# Patient Record
Sex: Female | Born: 1995 | Hispanic: Yes | Marital: Single | Smoking: Never smoker
Health system: Southern US, Community
[De-identification: ages and names within clinical notes are randomized; demographics above are authoritative.]

## PROBLEM LIST (undated history)

## (undated) DIAGNOSIS — N200 Calculus of kidney: Secondary | ICD-10-CM

## (undated) DIAGNOSIS — Z789 Other specified health status: Secondary | ICD-10-CM

## (undated) HISTORY — PX: NO PAST SURGERIES: SHX2092

## (undated) HISTORY — DX: Other specified health status: Z78.9

## (undated) HISTORY — DX: Calculus of kidney: N20.0

---
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
## 2013-08-13 ENCOUNTER — Emergency Department: Payer: Self-pay | Admitting: Emergency Medicine

## 2013-08-13 LAB — BASIC METABOLIC PANEL
Anion Gap: 5 — ABNORMAL LOW (ref 7–16)
BUN: 14 mg/dL (ref 7–18)
CALCIUM: 9.7 mg/dL (ref 9.0–10.7)
CHLORIDE: 105 mmol/L (ref 98–107)
CO2: 25 mmol/L (ref 21–32)
Creatinine: 0.71 mg/dL (ref 0.60–1.30)
EGFR (African American): >60
EGFR (Non-African Amer.): >60
Glucose: 101 mg/dL — ABNORMAL HIGH (ref 65–99)
Osmolality: 271 (ref 275–301)
POTASSIUM: 3.8 mmol/L (ref 3.5–5.1)
Sodium: 135 mmol/L — ABNORMAL LOW (ref 136–145)

## 2013-08-13 LAB — CBC
HCT: 43.2 % (ref 35.0–47.0)
HGB: 13.8 g/dL (ref 12.0–16.0)
MCH: 29.4 pg (ref 26.0–34.0)
MCHC: 32 g/dL (ref 32.0–36.0)
MCV: 92 fL (ref 80–100)
Platelet: 148 10*3/uL — ABNORMAL LOW (ref 150–440)
RBC: 4.72 10*6/uL (ref 3.80–5.20)
RDW: 13.5 % (ref 11.5–14.5)
WBC: 19.5 10*3/uL — ABNORMAL HIGH (ref 3.6–11.0)

## 2013-08-16 LAB — BETA STREP CULTURE(ARMC)

## 2013-12-09 ENCOUNTER — Emergency Department: Payer: Self-pay | Admitting: Emergency Medicine

## 2013-12-09 LAB — URINALYSIS, COMPLETE
BACTERIA: NONE SEEN
Bilirubin,UR: NEGATIVE
GLUCOSE, UR: NEGATIVE mg/dL (ref 0–75)
Ketone: NEGATIVE
Leukocyte Esterase: NEGATIVE
Nitrite: NEGATIVE
Ph: 5 (ref 4.5–8.0)
Protein: NEGATIVE
RBC,UR: 4 /HPF (ref 0–5)
SPECIFIC GRAVITY: 1.026 (ref 1.003–1.030)
Squamous Epithelial: 1
WBC UR: 3 /HPF (ref 0–5)

## 2013-12-09 LAB — BASIC METABOLIC PANEL
Anion Gap: 3 — ABNORMAL LOW (ref 7–16)
BUN: 8 mg/dL (ref 7–18)
CO2: 29 mmol/L (ref 21–32)
CREATININE: 0.69 mg/dL (ref 0.60–1.30)
Calcium, Total: 9.1 mg/dL (ref 9.0–10.7)
Chloride: 107 mmol/L (ref 98–107)
GLUCOSE: 83 mg/dL (ref 65–99)
OSMOLALITY: 275 (ref 275–301)
POTASSIUM: 4.1 mmol/L (ref 3.5–5.1)
Sodium: 139 mmol/L (ref 136–145)

## 2013-12-09 LAB — CBC
HCT: 39.8 % (ref 35.0–47.0)
HGB: 13.3 g/dL (ref 12.0–16.0)
MCH: 30.8 pg (ref 26.0–34.0)
MCHC: 33.4 g/dL (ref 32.0–36.0)
MCV: 92 fL (ref 80–100)
Platelet: 268 10*3/uL (ref 150–440)
RBC: 4.32 10*6/uL (ref 3.80–5.20)
RDW: 13 % (ref 11.5–14.5)
WBC: 5.6 10*3/uL (ref 3.6–11.0)

## 2016-08-18 DIAGNOSIS — L700 Acne vulgaris: Secondary | ICD-10-CM | POA: Insufficient documentation

## 2017-10-25 HISTORY — PX: INTRAUTERINE DEVICE (IUD) INSERTION: SHX5877

## 2018-07-27 ENCOUNTER — Ambulatory Visit: Payer: Self-pay | Admitting: Obstetrics and Gynecology

## 2018-08-10 ENCOUNTER — Ambulatory Visit: Payer: Self-pay | Admitting: Obstetrics and Gynecology

## 2018-09-03 ENCOUNTER — Other Ambulatory Visit (HOSPITAL_COMMUNITY)
Admission: RE | Admit: 2018-09-03 | Discharge: 2018-09-03 | Disposition: A | Discharge status: Home / Self Care | Payer: BLUE CROSS/BLUE SHIELD | Source: Ambulatory Visit | Attending: Obstetrics and Gynecology | Admitting: Obstetrics and Gynecology

## 2018-09-03 ENCOUNTER — Ambulatory Visit (INDEPENDENT_AMBULATORY_CARE_PROVIDER_SITE_OTHER): Payer: BLUE CROSS/BLUE SHIELD | Admitting: Obstetrics and Gynecology

## 2018-09-03 ENCOUNTER — Encounter: Payer: Self-pay | Admitting: Obstetrics and Gynecology

## 2018-09-03 VITALS — BP 104/64 | HR 66 | Ht 64.0 in | Wt 152.0 lb

## 2018-09-03 DIAGNOSIS — Z30431 Encounter for routine checking of intrauterine contraceptive device: Secondary | ICD-10-CM

## 2018-09-03 DIAGNOSIS — Z124 Encounter for screening for malignant neoplasm of cervix: Secondary | ICD-10-CM | POA: Diagnosis not present

## 2018-09-03 DIAGNOSIS — Z113 Encounter for screening for infections with a predominantly sexual mode of transmission: Secondary | ICD-10-CM

## 2018-09-03 DIAGNOSIS — Z01419 Encounter for gynecological examination (general) (routine) without abnormal findings: Secondary | ICD-10-CM

## 2018-09-03 DIAGNOSIS — R102 Pelvic and perineal pain: Secondary | ICD-10-CM

## 2018-09-03 NOTE — Progress Notes (Signed)
Gynecology Annual Exam  PCP: Patient, No Pcp Per  Chief Complaint:  Chief Complaint  Patient presents with  . Pelvic Pain    IUD check    History of Present Illness: Patient is a 23 y.o. G0P0000 presents for annual exam. The patient has no complaints today.   LMP: No LMP recorded. (Menstrual status: IUD). Absent on [place] IUD  The patient is sexually active. She currently uses IUD for contraception. She does report dyspareunia.    There is no notable family history of breast or ovarian cancer in her family.  The patient wears seatbelts: yes.  The patient has regular exercise: not asked.    She reports intermittent pelvic cramping since IUD placement.  She has previously reported heavy menses as well as dysmenorrhea.  Has achieved amenorrhea on IUD.  She does report cramping with intercourse as well.  Has not checked for the strings.  Review of Systems: Review of Systems  Constitutional: Negative.   Respiratory: Negative.   Cardiovascular: Negative.   Gastrointestinal: Positive for abdominal pain and constipation. Negative for diarrhea, heartburn, nausea and vomiting.  Skin: Negative.   Endo/Heme/Allergies: Negative.   Psychiatric/Behavioral: Negative.     Past Medical History:  Past Medical History:  Diagnosis Date  . No known health problems     Past Surgical History:  Past Surgical History:  Procedure Laterality Date  . INTRAUTERINE DEVICE (IUD) INSERTION  10/2017   Kyleena  . NO PAST SURGERIES      Gynecologic History:  No LMP recorded. (Menstrual status: IUD). Contraception: 11/22/2017 Rutha Bouchard IUD Last Pap: Results were: 08/22/2015 Pap NIL HPV neg  Obstetric History: G0P0000  Family History:  History reviewed. No pertinent family history.  Social History:  Social History   Socioeconomic History  . Marital status: Single    Spouse name: Not on file  . Number of children: Not on file  . Years of education: Not on file  . Highest education level:  Not on file  Occupational History  . Not on file  Social Needs  . Financial resource strain: Not on file  . Food insecurity:    Worry: Not on file    Inability: Not on file  . Transportation needs:    Medical: Not on file    Non-medical: Not on file  Tobacco Use  . Smoking status: Never Smoker  . Smokeless tobacco: Never Used  Substance and Sexual Activity  . Alcohol use: Yes    Comment: OCC  . Drug use: Never  . Sexual activity: Yes    Partners: Male    Birth control/protection: I.U.D.  Lifestyle  . Physical activity:    Days per week: Not on file    Minutes per session: Not on file  . Stress: Not on file  Relationships  . Social connections:    Talks on phone: Not on file    Gets together: Not on file    Attends religious service: Not on file    Active member of club or organization: Not on file    Attends meetings of clubs or organizations: Not on file    Relationship status: Not on file  . Intimate partner violence:    Fear of current or ex partner: Not on file    Emotionally abused: Not on file    Physically abused: Not on file    Forced sexual activity: Not on file  Other Topics Concern  . Not on file  Social History Narrative  .  Not on file    Allergies:  No Known Allergies  Medications: Prior to Admission medications   Medication Sig Start Date End Date Taking? Authorizing Provider  Levonorgestrel (KYLEENA) 19.5 MG IUD by Intrauterine route.   Yes [provider]    Physical Exam Vitals: Blood pressure 104/64, pulse 66, height 5\' 4"  (1.626 m), weight 152 lb (68.9 kg).  General: NAD HEENT: normocephalic, anicteric Thyroid: no enlargement, no palpable nodules Pulmonary: No increased work of breathing, CTAB Cardiovascular: RRR, distal pulses 2+ Abdomen: Soft, non-tender, non-distended.  Umbilicus without lesions.   Genitourinary:  External: Normal external female genitalia.  Normal urethral meatus, normal Bartholin's and Skene's glands.      Vagina: Normal vaginal mucosa, no evidence of prolapse.    Cervix: Grossly normal in appearance, no bleeding. IUD strings 3cm  Uterus: Non-enlarged, mobile, normal contour.  No CMT  Adnexa: ovaries non-enlarged, no adnexal masses  Rectal: deferred  Lymphatic: no evidence of inguinal lymphadenopathy Extremities: no edema, erythema, or tenderness Neurologic: Grossly intact Psychiatric: mood appropriate, affect full  Female chaperone present for pelvic and breast  portions of the physical exam    Assessment: 23 y.o. G0P0000 routine annual exam  Plan: Problem List Items Addressed This Visit    None    Visit Diagnoses    Encounter for gynecological examination without abnormal finding    -  Primary   Screening for malignant neoplasm of cervix       Relevant Orders   Cytology - PAP   Routine screening for STI (sexually transmitted infection)       Relevant Orders   Cytology - PAP   IUD check up       Relevant Orders   US PELVIC COMPLETE WITH TRANSVAGINAL   Pelvic pain in female       Relevant Orders   US PELVIC COMPLETE WITH TRANSVAGINAL      1) 4) Gardasil Series discussed and if applicable offered to patient - Patient has not previously completed 3 shot series  - given handout on Gardasil 9  2) STI screening  GC/CT cultures obtained secondary to cramping  3)  ASCCP guidelines and rational discussed.  Patient opts for every 3 years screening interval  4) Contraception - the patient is currently using  IUD.  She is happy with her current form of contraception and plans to continue We discussed safe sex practices to reduce her furture risk of STI's.  - states if IUD in proper location cramping is not signifcant enough where she would desire removal.  Will proceed with ultrasound for confirmation of placement   5) Return in about 1 year (around 09/03/2019), or You will receive a call about the ultrasound scheduling, for annual.   Vena Austria, MD, [facility]  [facility], [facility] 09/03/2018, 10:00 AM

## 2018-09-04 ENCOUNTER — Telehealth: Payer: Self-pay | Admitting: Obstetrics and Gynecology

## 2018-09-04 NOTE — Telephone Encounter (Signed)
Patient is aware of appointment, location, and instructions, and f/u appt w/ Dr. Bonney Aid has been scheduled.

## 2018-09-04 NOTE — Telephone Encounter (Signed)
Once again not me just saw her yesterday

## 2018-09-04 NOTE — Telephone Encounter (Signed)
I did not call patient. Message forwarded to AMS

## 2018-09-04 NOTE — Telephone Encounter (Signed)
Nancy, please advise..

## 2018-09-04 NOTE — Telephone Encounter (Signed)
Patient received missed call. Patient was seen 09/03/18.

## 2018-09-04 NOTE — Telephone Encounter (Signed)
Patient is schedule for ultrasound Tonya May was trying to reach patient, I didn't see note in referral until I have sent this message

## 2018-09-06 LAB — CYTOLOGY - PAP
Chlamydia: NEGATIVE
DIAGNOSIS: NEGATIVE
NEISSERIA GONORRHEA: NEGATIVE

## 2018-09-17 ENCOUNTER — Ambulatory Visit: Payer: BLUE CROSS/BLUE SHIELD

## 2018-09-20 ENCOUNTER — Ambulatory Visit: Payer: BLUE CROSS/BLUE SHIELD | Admitting: Obstetrics and Gynecology

## 2018-10-15 ENCOUNTER — Ambulatory Visit: Payer: BLUE CROSS/BLUE SHIELD

## 2018-10-22 ENCOUNTER — Encounter: Payer: Self-pay | Admitting: Obstetrics and Gynecology

## 2018-10-22 ENCOUNTER — Other Ambulatory Visit: Payer: Self-pay

## 2018-10-22 ENCOUNTER — Ambulatory Visit (INDEPENDENT_AMBULATORY_CARE_PROVIDER_SITE_OTHER): Payer: BLUE CROSS/BLUE SHIELD

## 2018-10-22 ENCOUNTER — Ambulatory Visit (INDEPENDENT_AMBULATORY_CARE_PROVIDER_SITE_OTHER): Payer: BLUE CROSS/BLUE SHIELD | Admitting: Obstetrics and Gynecology

## 2018-10-22 VITALS — BP 100/64 | HR 71 | Wt 153.0 lb

## 2018-10-22 DIAGNOSIS — Z30431 Encounter for routine checking of intrauterine contraceptive device: Secondary | ICD-10-CM

## 2018-10-22 DIAGNOSIS — R102 Pelvic and perineal pain: Secondary | ICD-10-CM | POA: Diagnosis not present

## 2018-10-22 NOTE — Progress Notes (Signed)
Gynecology Ultrasound Follow Up  Chief Complaint:  Chief Complaint  Patient presents with  . Follow-up    GYN ultrasound     History of Present Illness: Patient is a 23 y.o. female who presents today for ultrasound evaluation of pelvic pain, cramping .  Ultrasound demonstrates the following findgins Adnexa: no masses seen { Uterus: Non-enlarged, no evidence of fibroid with endometrial stripe  5mm and homogenous, IUD in proper location with appropriate deployment of both arms Additional: no free fluid  Review of Systems: Review of Systems  Constitutional: Negative.   Genitourinary: Negative.     Past Medical History:  Past Medical History:  Diagnosis Date  . No known health problems     Past Surgical History:  Past Surgical History:  Procedure Laterality Date  . INTRAUTERINE DEVICE (IUD) INSERTION  10/2017   Kyleena  . NO PAST SURGERIES      Gynecologic History:  No LMP recorded. (Menstrual status: IUD). Contraception: IUD Last Pap: 09/03/2018  Results were: .no abnormalities  Family History:  History reviewed. No pertinent family history.  Social History:  Social History   Socioeconomic History  . Marital status: Single    Spouse name: Not on file  . Number of children: Not on file  . Years of education: Not on file  . Highest education level: Not on file  Occupational History  . Not on file  Social Needs  . Financial resource strain: Not on file  . Food insecurity:    Worry: Not on file    Inability: Not on file  . Transportation needs:    Medical: Not on file    Non-medical: Not on file  Tobacco Use  . Smoking status: Never Smoker  . Smokeless tobacco: Never Used  Substance and Sexual Activity  . Alcohol use: Yes    Comment: OCC  . Drug use: Never  . Sexual activity: Yes    Partners: Male    Birth control/protection: I.U.D.  Lifestyle  . Physical activity:    Days per week: Not on file    Minutes per session: Not on file  . Stress: Not  on file  Relationships  . Social connections:    Talks on phone: Not on file    Gets together: Not on file    Attends religious service: Not on file    Active member of club or organization: Not on file    Attends meetings of clubs or organizations: Not on file    Relationship status: Not on file  . Intimate partner violence:    Fear of current or ex partner: Not on file    Emotionally abused: Not on file    Physically abused: Not on file    Forced sexual activity: Not on file  Other Topics Concern  . Not on file  Social History Narrative  . Not on file    Allergies:  No Known Allergies  Medications: Prior to Admission medications   Medication Sig Start Date End Date Taking? Authorizing Provider  Levonorgestrel (KYLEENA) 19.5 MG IUD by Intrauterine route.    [provider]    Physical Exam Vitals: Blood pressure 100/64, pulse 71, weight 153 lb (69.4 kg).  General: NAD, well nourished, appears stated age HEENT: normocephalic, anicteric Pulmonary: No increased work of breathing Neurologic: Grossly intact, normal gait Psychiatric: mood appropriate, affect full  [place] Pelvic Complete With Transvaginal  Result Date: 10/22/2018 [name] DOB: Sep 27, 1995 MRN: [id number] ULTRASOUND REPORT [facility] Date of Service: 10/22/2018 Indication: IUD Findings: The uterus is anteverted and measures 7.01 x 2.87 x 2.85 cm. Echo texture is homogenous without evidence of focal masses. The Endometrium measures 5 mm. Right Ovary measures 2.8 x 2.3 x 2.0 cm. It is normal in appearance. Left Ovary measures 2.9 x 1.8 x 1.7 cm. It is normal in appearance. Survey of the adnexa demonstrates no adnexal masses. There is no free fluid in the cul de sac. Impression: 1.IUD seen with in the Endometrium. Recommendations: 1.Clinical correlation with the patient's History and Physical Exam. Clydene Laming, RDMS Images reviewed.  Normal GYN study without visualized pathology.   Vena Austria, MD, [facility], [facility] 10/22/2018, 11:32 AM     Assessment: 23 y.o. G0P0000 IUD check up and follow up ultrasound pelvic pain  Plan: Problem List Items Addressed This Visit    None    Visit Diagnoses    Pelvic pain in female    -  Primary      1) Pelvic pain - IUD in appropriate position, no GYN pathology on imaging.  At this time patient feels sufficiently reassured and would like to continue with Mirena IUD  2) A total of 15 minutes were spent in face-to-face contact with the patient during this encounter with over half of that time devoted to counseling and coordination of care.  3) Return in about 1 year (around 10/22/2019) for annual exam.    Vena Austria, MD, [facility], [facility] 10/22/2018, 11:30 AM

## 2019-01-02 ENCOUNTER — Encounter: Payer: Self-pay | Admitting: *Deleted

## 2019-01-02 ENCOUNTER — Emergency Department: Payer: BLUE CROSS/BLUE SHIELD

## 2019-01-02 ENCOUNTER — Other Ambulatory Visit: Payer: Self-pay

## 2019-01-02 ENCOUNTER — Emergency Department
Admission: EM | Admit: 2019-01-02 | Discharge: 2019-01-02 | Disposition: A | Discharge status: Home / Self Care | Payer: BLUE CROSS/BLUE SHIELD | Attending: Emergency Medicine | Admitting: Emergency Medicine

## 2019-01-02 DIAGNOSIS — K529 Noninfective gastroenteritis and colitis, unspecified: Secondary | ICD-10-CM | POA: Insufficient documentation

## 2019-01-02 DIAGNOSIS — E876 Hypokalemia: Secondary | ICD-10-CM | POA: Insufficient documentation

## 2019-01-02 DIAGNOSIS — Z3202 Encounter for pregnancy test, result negative: Secondary | ICD-10-CM | POA: Insufficient documentation

## 2019-01-02 DIAGNOSIS — R1031 Right lower quadrant pain: Secondary | ICD-10-CM | POA: Diagnosis present

## 2019-01-02 LAB — COMPREHENSIVE METABOLIC PANEL
ALT: 27 U/L (ref 0–44)
AST: 32 U/L (ref 15–41)
Albumin: 4.5 g/dL (ref 3.5–5.0)
Alkaline Phosphatase: 68 U/L (ref 38–126)
Anion gap: 13 (ref 5–15)
BUN: 9 mg/dL (ref 6–20)
CO2: 21 mmol/L — ABNORMAL LOW (ref 22–32)
Calcium: 9.1 mg/dL (ref 8.9–10.3)
Chloride: 103 mmol/L (ref 98–111)
Creatinine, Ser: 0.61 mg/dL (ref 0.44–1.00)
GFR calc Af Amer: >60 mL/min (ref >60)
GFR calc non Af Amer: >60 mL/min (ref >60)
Glucose, Bld: 130 mg/dL — ABNORMAL HIGH (ref 70–99)
Potassium: 2.7 mmol/L — CL (ref 3.5–5.1)
Sodium: 137 mmol/L (ref 135–145)
Total Bilirubin: 0.4 mg/dL (ref 0.3–1.2)
Total Protein: 7.9 g/dL (ref 6.5–8.1)

## 2019-01-02 LAB — URINALYSIS, COMPLETE (UACMP) WITH MICROSCOPIC
Bacteria, UA: NONE SEEN
Bilirubin Urine: NEGATIVE
Glucose, UA: NEGATIVE mg/dL
Ketones, ur: 5 mg/dL — AB
Leukocytes,Ua: NEGATIVE
Nitrite: NEGATIVE
Protein, ur: NEGATIVE mg/dL
Specific Gravity, Urine: 1.016 (ref 1.005–1.030)
pH: 5 (ref 5.0–8.0)

## 2019-01-02 LAB — CBC
HCT: 39.3 % (ref 36.0–46.0)
Hemoglobin: 13.2 g/dL (ref 12.0–15.0)
MCH: 29.5 pg (ref 26.0–34.0)
MCHC: 33.6 g/dL (ref 30.0–36.0)
MCV: 87.7 fL (ref 80.0–100.0)
Platelets: 348 10*3/uL (ref 150–400)
RBC: 4.48 MIL/uL (ref 3.87–5.11)
RDW: 12.3 % (ref 11.5–15.5)
WBC: 14.9 10*3/uL — ABNORMAL HIGH (ref 4.0–10.5)
nRBC: 0 % (ref 0.0–0.2)

## 2019-01-02 LAB — LIPASE, BLOOD: Lipase: 31 U/L (ref 11–51)

## 2019-01-02 LAB — POCT PREGNANCY, URINE: Preg Test, Ur: NEGATIVE

## 2019-01-02 MED ORDER — IOHEXOL 300 MG/ML  SOLN
100.0000 mL | Freq: Once | INTRAMUSCULAR | Status: AC | PRN
  Administered 2019-01-02: 05:00:00 100 mL via INTRAVENOUS

## 2019-01-02 MED ORDER — HYDROCODONE-ACETAMINOPHEN 5-325 MG PO TABS: 1.0000 | ORAL_TABLET | ORAL | 0 refills | Status: DC | PRN

## 2019-01-02 MED ORDER — ONDANSETRON 4 MG PO TBDP: 4.0000 mg | ORAL_TABLET | Freq: Three times a day (TID) | ORAL | 0 refills | Status: DC | PRN

## 2019-01-02 MED ORDER — POTASSIUM CHLORIDE CRYS ER 10 MEQ PO TBCR: 10.0000 meq | EXTENDED_RELEASE_TABLET | Freq: Two times a day (BID) | ORAL | 0 refills | Status: DC

## 2019-01-02 MED ORDER — IOHEXOL 240 MG/ML SOLN
50.0000 mL | Freq: Once | INTRAMUSCULAR | Status: AC
  Administered 2019-01-02: 50 mL via ORAL

## 2019-01-02 MED ORDER — SODIUM CHLORIDE 0.9% FLUSH: 3.0000 mL | Freq: Once | INTRAVENOUS | Status: DC

## 2019-01-02 MED ORDER — SODIUM CHLORIDE 0.9 % IV BOLUS
1000.0000 mL | Freq: Once | INTRAVENOUS | Status: AC
  Administered 2019-01-02: 1000 mL via INTRAVENOUS

## 2019-01-02 MED ORDER — ONDANSETRON HCL 4 MG/2ML IJ SOLN
4.0000 mg | Freq: Once | INTRAMUSCULAR | Status: AC
  Administered 2019-01-02: 4 mg via INTRAVENOUS
  Filled 2019-01-02: qty 2

## 2019-01-02 MED ORDER — MORPHINE SULFATE (PF) 4 MG/ML IV SOLN
4.0000 mg | Freq: Once | INTRAVENOUS | Status: AC
  Administered 2019-01-02: 4 mg via INTRAVENOUS
  Filled 2019-01-02: qty 1

## 2019-01-02 NOTE — ED Triage Notes (Signed)
Pt reports abd pain that started tonight.  Pt states she has nausea.   No vomiting/diarrhea. Taking otc meds without relief.  No urinary sx. No vag bleeding.   Pt alert.

## 2019-01-02 NOTE — ED Provider Notes (Signed)
[facility] Emergency Department Provider Note  Time seen: 3:47 AM  I have reviewed the triage vital signs and the nursing notes.   HISTORY  Chief Complaint Abdominal Pain  HPI Tonya May is a 23 y.o. female with no significant past medical history presents emergency department for lower abdominal pain since last night.  According to the patient since last night she developed lower abdominal pain mostly in the right lower quadrant.  States she is also been nauseated, developed vomiting and diarrhea.  Patient denies any known fever.  Denies any cough congestion or shortness of breath.  Describes her abdominal pain as moderate dull aching pain across the lower abdomen but more so in the right lower quadrant.   Patient denies any dysuria or hematuria.  Denies any vaginal bleeding or discharge.  Past Medical History:  Diagnosis Date  . No known health problems     There are no active problems to display for this patient.   Past Surgical History:  Procedure Laterality Date  . INTRAUTERINE DEVICE (IUD) INSERTION  10/2017   Kyleena  . NO PAST SURGERIES      Prior to Admission medications   Medication Sig Start Date End Date Taking? Authorizing Provider  Levonorgestrel (KYLEENA) 19.5 MG IUD by Intrauterine route.    [provider]    No Known Allergies  No family history on file.  Social History Social History   Tobacco Use  . Smoking status: Never Smoker  . Smokeless tobacco: Never Used  Substance Use Topics  . Alcohol use: Yes    Comment: OCC  . Drug use: Never    Review of Systems Constitutional: Negative for fever. Cardiovascular: Negative for chest pain. Respiratory: Negative for shortness of breath. Gastrointestinal: Moderate right lower quadrant abdominal pain.  Moderate lower abdominal pain.  Positive for nausea vomiting diarrhea. Genitourinary: Negative for urinary compaints.  Negative for vaginal bleeding or  discharge. Musculoskeletal: Negative for musculoskeletal complaints Skin: Negative for skin complaints  Neurological: Negative for headache All other ROS negative  ____________________________________________   PHYSICAL EXAM:  VITAL SIGNS: ED Triage Vitals  Enc Vitals Group     BP 01/02/19 0026 119/63     Pulse Rate 01/02/19 0026 82     Resp 01/02/19 0026 (!) 24     Temp 01/02/19 0026 98.9 F (37.2 C)     Temp Source 01/02/19 0026 Oral     SpO2 01/02/19 0026 100 %     Weight --      Height --      Head Circumference --      Peak Flow --      Pain Score 01/02/19 0027 8     Pain Loc --      Pain Edu? --      Excl. in [place]? --    Constitutional: Alert and oriented. Well appearing and in no distress. Eyes: Normal exam ENT      Head: Normocephalic and atraumatic.      Mouth/Throat: Mucous membranes are moist. Cardiovascular: Normal rate, regular rhythm.  Respiratory: Normal respiratory effort without tachypnea nor retractions. Breath sounds are clear Gastrointestinal: Soft, moderate lower abdominal tenderness to palpation especially in the right lower quadrant.  Tender over McBurney's point.  No rebound guarding or distention. Musculoskeletal: Nontender with normal range of motion in all extremities.  Neurologic:  Normal speech and language. No gross focal neurologic deficits  Skin:  Skin is warm, dry and intact.  Psychiatric: Mood and affect  are normal. Speech and behavior are normal.   ____________________________________________    RADIOLOGY  CT scan shows no acute abnormalities.  ____________________________________________   INITIAL IMPRESSION / ASSESSMENT AND PLAN / ED COURSE  Pertinent labs & imaging results that were available during my care of the patient were reviewed by me and considered in my medical decision making (see chart for details).   Patient presents emergency department for right lower quadrant abdominal pain nausea vomiting diarrhea.   Differential would include gastroenteritis, enteritis, mesenteric adenitis, appendicitis, ovarian cyst or hemorrhagic cyst.  Patient's lab work shows a moderate leukocytosis of 14,900, mild hypokalemia.  Pregnancy test is negative.  Urinalysis shows no acute findings.  We will obtain a CT scan of the abdomen/pelvis to further evaluate and to rule out appendicitis.  Patient does not wish for any pain or nausea medication at this time we will IV hydrate prior to CT imaging.  CT does not appear to show any acute abnormality such as appendicitis, possible mild lymphadenopathy.  Possible gastroenteritis.  We will treat pain and nausea in the emergency department of breath this patient is now agreeable).  Overall the patient appears well, with an overall reassuring work-up.  We will discharge with PCP follow-up.  Patient agreeable to plan of care.  Tonya May was evaluated in Emergency Department on 01/02/2019 for the symptoms described in the history of present illness. She was evaluated in the context of the global COVID-19 pandemic, which necessitated consideration that the patient might be at risk for infection with the SARS-CoV-2 virus that causes COVID-19. Institutional protocols and algorithms that pertain to the evaluation of patients at risk for COVID-19 are in a state of rapid change based on information released by regulatory bodies including the CDC and federal and state organizations. These policies and algorithms were followed during the patient's care in the ED.  ____________________________________________   FINAL CLINICAL IMPRESSION(S) / ED DIAGNOSES  Right lower quadrant abdominal pain   Minna AntisPaduchowski, Dasani Crear, MD 01/02/19 [id number]

## 2020-11-04 IMAGING — CT CT ABDOMEN AND PELVIS WITH CONTRAST
2 of 4 series · 14 of 46 positions shown, 16 images · IV contrast (APPLIED)
Comparison: One-view abdomen 04/26/2011

CLINICAL DATA: Abdominal pain.  Appendicitis suspected.

EXAM:
CT ABDOMEN AND PELVIS WITH CONTRAST
TECHNIQUE: Multidetector CT imaging of the abdomen and pelvis was performed
using the standard protocol following bolus administration of
intravenous contrast.
CONTRAST:  100mL OMNIPAQUE IOHEXOL 300 MG/ML  SOLN

[Series 2: routine abd/pel with · axial · 0.62mm/px · z∈[-883,-493]mm · 11 of 94 slices shown, 13 images]
[im 8/94  soft-tissue]
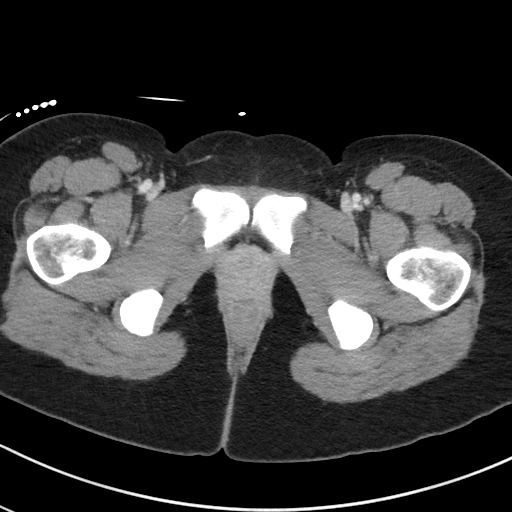
[im 8/94  bone]
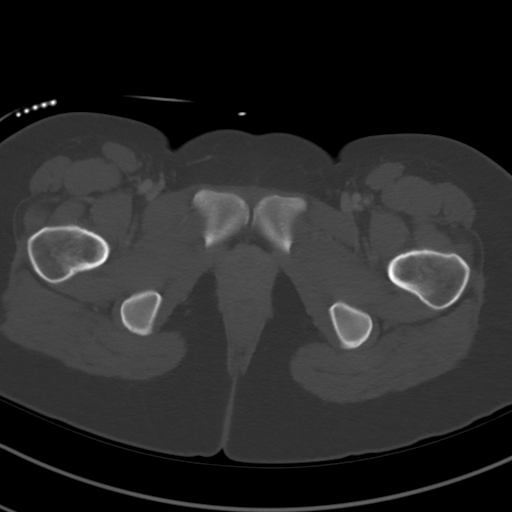
[im 16/94  soft-tissue]
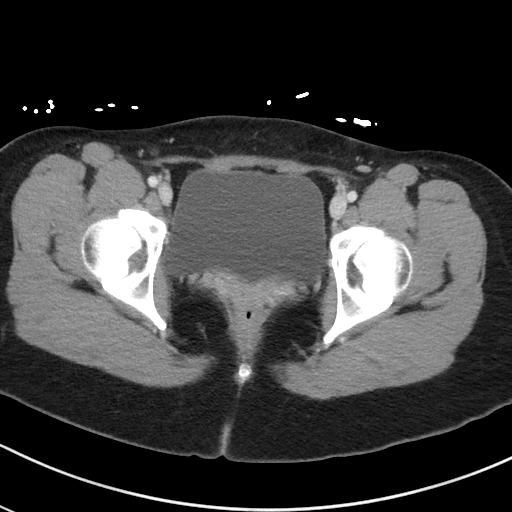
[im 24/94  soft-tissue]
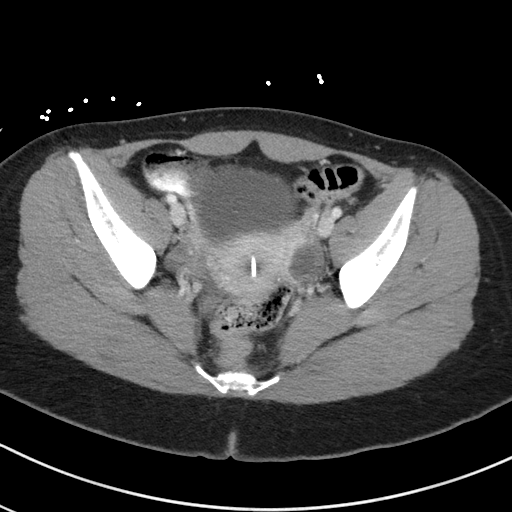
[im 32/94  soft-tissue]
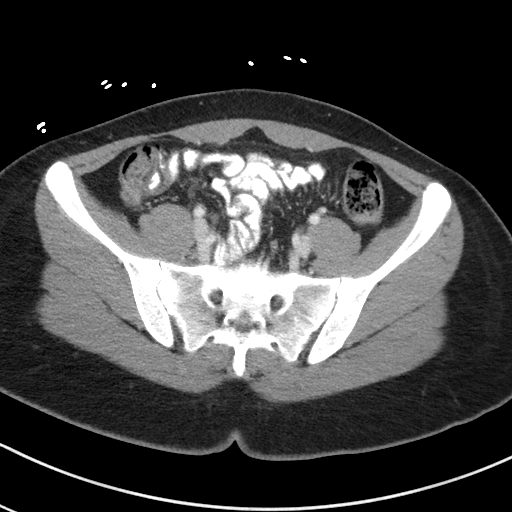
[im 39/94  soft-tissue]
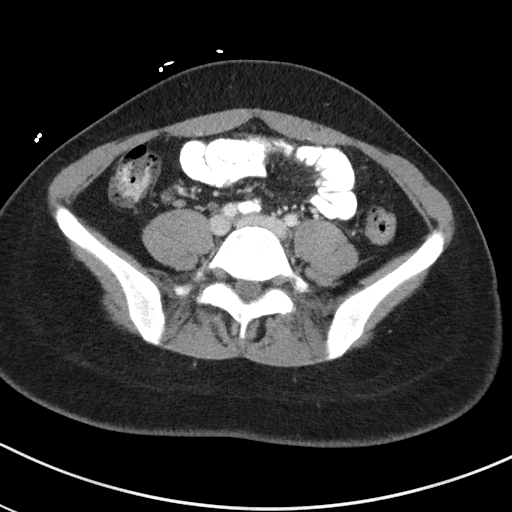
[im 47/94  soft-tissue]
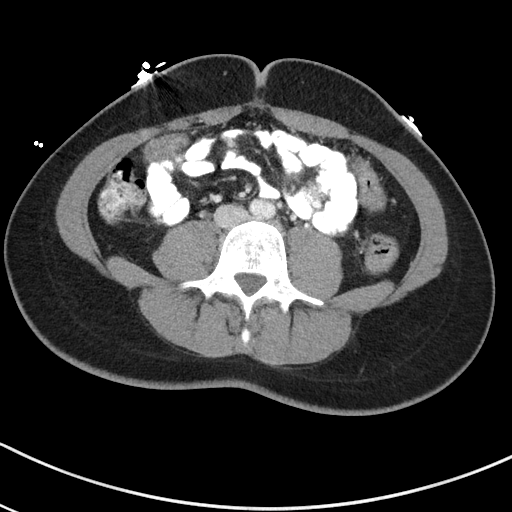
[im 55/94  soft-tissue]
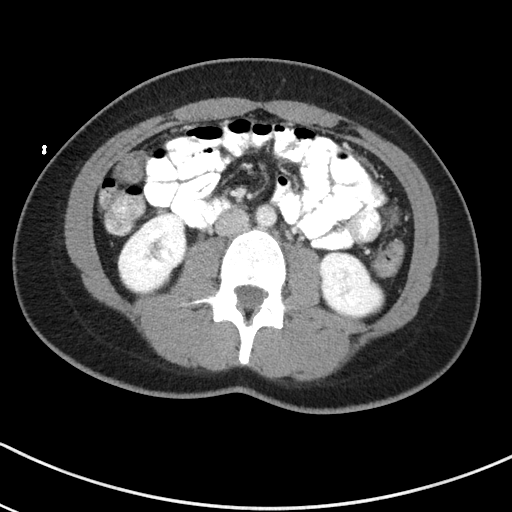
[im 63/94  soft-tissue]
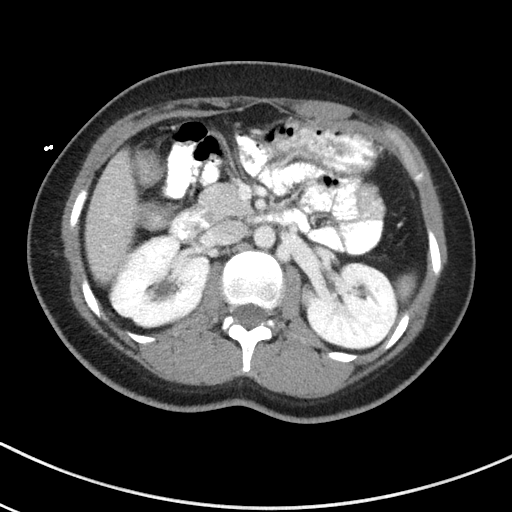
[im 70/94  soft-tissue]
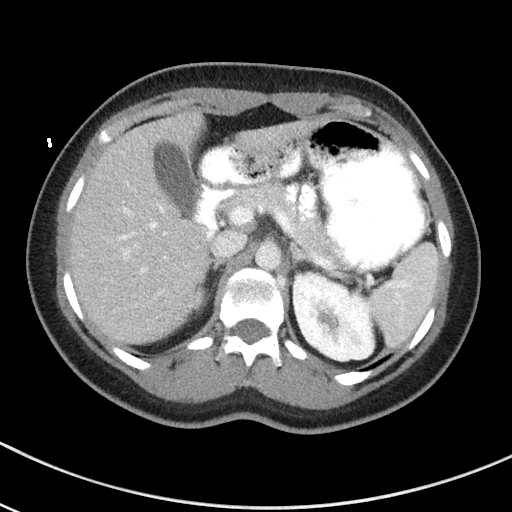
[im 70/94  bone]
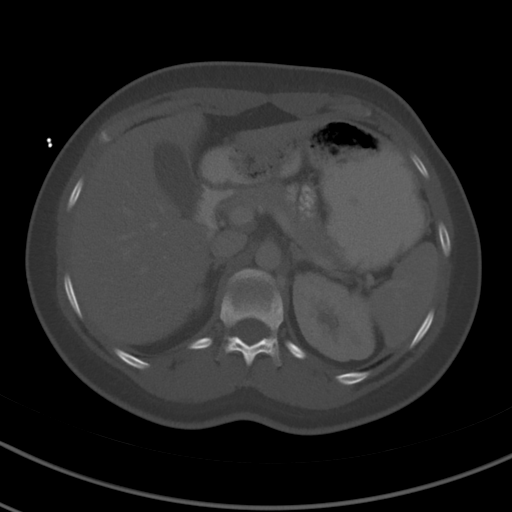
[im 78/94  soft-tissue]
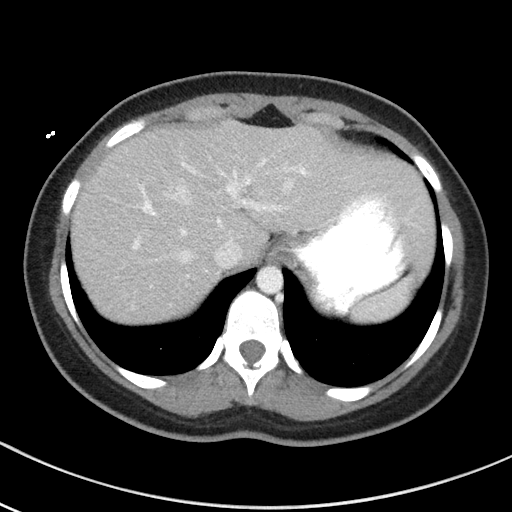
[im 86/94  soft-tissue]
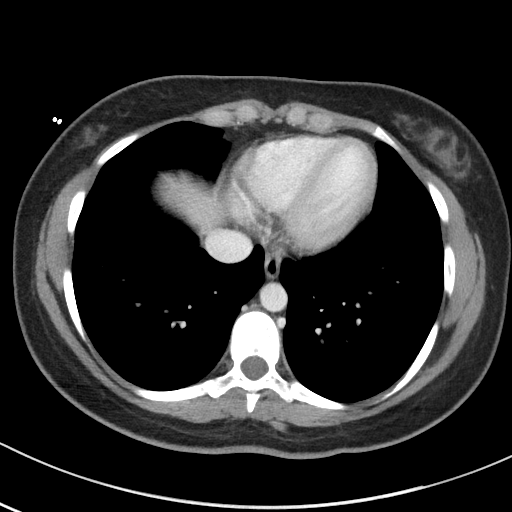

[Series 5: coronal st · coronal · 0.61mm/px · 3 of 82 slices shown]
[im 28/82  soft-tissue]
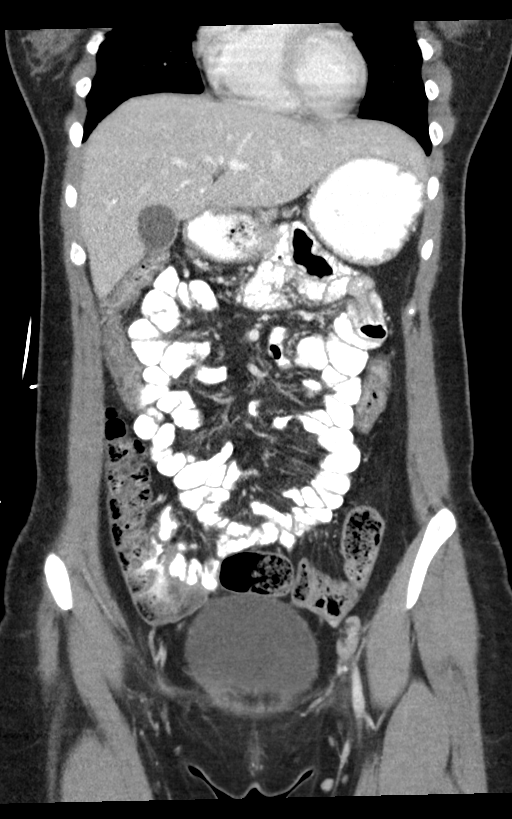
[im 37/82  soft-tissue]
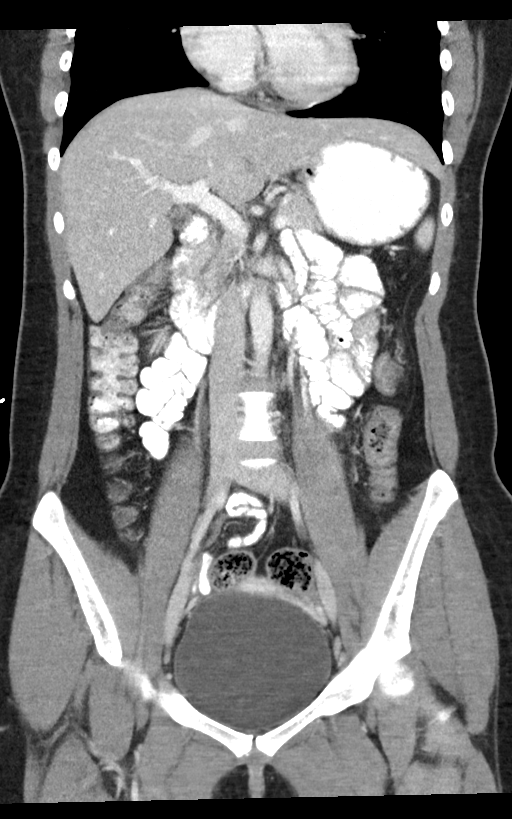
[im 46/82  soft-tissue]
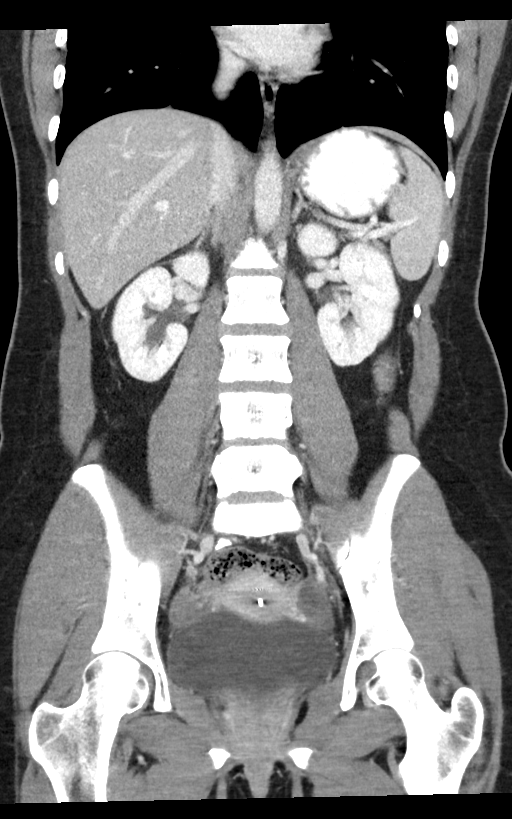

[14 of 46 positions shown; findings below may reference images not displayed]

FINDINGS: Lower chest: Lung bases are clear without focal nodule, mass, or
airspace disease. The heart size is normal. No significant pleural
or pericardial effusion is present.

Hepatobiliary: No focal liver abnormality is seen. No gallstones,
gallbladder wall thickening, or biliary dilatation.

Pancreas: Unremarkable. No pancreatic ductal dilatation or
surrounding inflammatory changes.

Spleen: Normal in size without focal abnormality.

Adrenals/Urinary Tract: Adrenal glands are normal bilaterally. A 5
mm hypodense lesion at the upper pole of the left kidney likely
represents a simple cyst. Kidneys and ureters are otherwise within
normal limits. There is no stone or mass lesion. The urinary bladder
is within normal limits.

Stomach/Bowel: The stomach and duodenum are within normal limits.
Small bowel is unremarkable. Contrast is present to the level of the
cecum. Terminal ileum is within normal limits. The appendix is
visualized and within normal limits. Ascending and transverse colon
are normal. Descending colon is normal. There is moderate stool
throughout the sigmoid colon. No significant inflammatory changes
are present.

Vascular/Lymphatic: No significant vascular findings are present.
Subcentimeter lymph nodes are present along the ileocolic ligament.
No significant retroperitoneal adenopathy is present.

Reproductive: IUD is in place. Uterus and adnexa are otherwise
within normal limits.

Other: There is no free fluid. No significant ventral hernia is
present.

Musculoskeletal: 5 non rib-bearing lumbar type vertebral bodies are
present. Vertebral body heights alignment are maintained. No focal
lytic or blastic lesions are present. Pelvis is within normal
limits. The hips are located and normal.
IMPRESSION: 1. No acute or focal lesion to explain the patient's right lower
quadrant abdominal pain. Normal appearance of the appendix.
2. Subcentimeter lymph nodes within the ileocolic ligament could
represent low level inflammatory change.
3. IUD without complicating features.

## 2022-05-27 DIAGNOSIS — Z419 Encounter for procedure for purposes other than remedying health state, unspecified: Secondary | ICD-10-CM | POA: Diagnosis not present

## 2022-06-17 DIAGNOSIS — J101 Influenza due to other identified influenza virus with other respiratory manifestations: Secondary | ICD-10-CM | POA: Diagnosis not present

## 2022-06-17 DIAGNOSIS — J019 Acute sinusitis, unspecified: Secondary | ICD-10-CM | POA: Diagnosis not present

## 2022-06-17 DIAGNOSIS — J028 Acute pharyngitis due to other specified organisms: Secondary | ICD-10-CM | POA: Diagnosis not present

## 2022-06-17 DIAGNOSIS — Z03818 Encounter for observation for suspected exposure to other biological agents ruled out: Secondary | ICD-10-CM | POA: Diagnosis not present

## 2022-06-27 DIAGNOSIS — Z419 Encounter for procedure for purposes other than remedying health state, unspecified: Secondary | ICD-10-CM | POA: Diagnosis not present

## 2022-06-28 DIAGNOSIS — R6889 Other general symptoms and signs: Secondary | ICD-10-CM | POA: Diagnosis not present

## 2022-07-26 ENCOUNTER — Encounter: Payer: Self-pay | Admitting: Internal Medicine

## 2022-07-26 ENCOUNTER — Inpatient Hospital Stay
Admission: EM | Admit: 2022-07-26 | Discharge: 2022-07-29 | DRG: 854 | Disposition: A | Discharge status: Home / Self Care | Payer: Medicaid Other | Attending: Student | Admitting: Student

## 2022-07-26 ENCOUNTER — Other Ambulatory Visit: Payer: Self-pay

## 2022-07-26 ENCOUNTER — Emergency Department: Payer: Medicaid Other

## 2022-07-26 DIAGNOSIS — A419 Sepsis, unspecified organism: Secondary | ICD-10-CM | POA: Diagnosis present

## 2022-07-26 DIAGNOSIS — A4101 Sepsis due to Methicillin susceptible Staphylococcus aureus: Secondary | ICD-10-CM | POA: Diagnosis not present

## 2022-07-26 DIAGNOSIS — E669 Obesity, unspecified: Secondary | ICD-10-CM | POA: Diagnosis present

## 2022-07-26 DIAGNOSIS — Z6833 Body mass index (BMI) 33.0-33.9, adult: Secondary | ICD-10-CM

## 2022-07-26 DIAGNOSIS — N136 Pyonephrosis: Secondary | ICD-10-CM | POA: Diagnosis not present

## 2022-07-26 DIAGNOSIS — N12 Tubulo-interstitial nephritis, not specified as acute or chronic: Secondary | ICD-10-CM

## 2022-07-26 DIAGNOSIS — R9431 Abnormal electrocardiogram [ECG] [EKG]: Secondary | ICD-10-CM | POA: Diagnosis not present

## 2022-07-26 DIAGNOSIS — N10 Acute tubulo-interstitial nephritis: Secondary | ICD-10-CM

## 2022-07-26 DIAGNOSIS — Z975 Presence of (intrauterine) contraceptive device: Secondary | ICD-10-CM | POA: Diagnosis not present

## 2022-07-26 DIAGNOSIS — N132 Hydronephrosis with renal and ureteral calculous obstruction: Secondary | ICD-10-CM | POA: Diagnosis not present

## 2022-07-26 DIAGNOSIS — N201 Calculus of ureter: Secondary | ICD-10-CM | POA: Diagnosis present

## 2022-07-26 DIAGNOSIS — N1330 Unspecified hydronephrosis: Secondary | ICD-10-CM | POA: Diagnosis present

## 2022-07-26 DIAGNOSIS — K760 Fatty (change of) liver, not elsewhere classified: Secondary | ICD-10-CM | POA: Diagnosis not present

## 2022-07-26 DIAGNOSIS — N200 Calculus of kidney: Secondary | ICD-10-CM | POA: Diagnosis not present

## 2022-07-26 DIAGNOSIS — N23 Unspecified renal colic: Secondary | ICD-10-CM | POA: Diagnosis not present

## 2022-07-26 HISTORY — DX: Acute pyelonephritis: N10

## 2022-07-26 LAB — URINALYSIS, ROUTINE W REFLEX MICROSCOPIC
Bilirubin Urine: NEGATIVE
Glucose, UA: NEGATIVE mg/dL
Ketones, ur: 5 mg/dL — AB
Nitrite: NEGATIVE
Protein, ur: 30 mg/dL — AB
Specific Gravity, Urine: 1.021 (ref 1.005–1.030)
WBC, UA: >50 WBC/hpf (ref 0–5)
pH: 6 (ref 5.0–8.0)

## 2022-07-26 LAB — CBC WITH DIFFERENTIAL/PLATELET
Abs Immature Granulocytes: 0.13 10*3/uL — ABNORMAL HIGH (ref 0.00–0.07)
Basophils Absolute: 0.1 10*3/uL (ref 0.0–0.1)
Basophils Relative: 0 %
Eosinophils Absolute: 0.2 10*3/uL (ref 0.0–0.5)
Eosinophils Relative: 1 %
HCT: 41 % (ref 36.0–46.0)
Hemoglobin: 13.4 g/dL (ref 12.0–15.0)
Immature Granulocytes: 1 %
Lymphocytes Relative: 16 %
Lymphs Abs: 3.5 10*3/uL (ref 0.7–4.0)
MCH: 28.8 pg (ref 26.0–34.0)
MCHC: 32.7 g/dL (ref 30.0–36.0)
MCV: 88.2 fL (ref 80.0–100.0)
Monocytes Absolute: 1.7 10*3/uL — ABNORMAL HIGH (ref 0.1–1.0)
Monocytes Relative: 8 %
Neutro Abs: 15.8 10*3/uL — ABNORMAL HIGH (ref 1.7–7.7)
Neutrophils Relative %: 74 %
Platelets: 366 10*3/uL (ref 150–400)
RBC: 4.65 MIL/uL (ref 3.87–5.11)
RDW: 13 % (ref 11.5–15.5)
WBC: 21.3 10*3/uL — ABNORMAL HIGH (ref 4.0–10.5)
nRBC: 0 % (ref 0.0–0.2)

## 2022-07-26 LAB — COMPREHENSIVE METABOLIC PANEL
ALT: 28 U/L (ref 0–44)
AST: 32 U/L (ref 15–41)
Albumin: 4.4 g/dL (ref 3.5–5.0)
Alkaline Phosphatase: 70 U/L (ref 38–126)
Anion gap: 7 (ref 5–15)
BUN: 14 mg/dL (ref 6–20)
CO2: 23 mmol/L (ref 22–32)
Calcium: 9 mg/dL (ref 8.9–10.3)
Chloride: 104 mmol/L (ref 98–111)
Creatinine, Ser: 0.94 mg/dL (ref 0.44–1.00)
GFR, Estimated: >60 mL/min (ref >60)
Glucose, Bld: 119 mg/dL — ABNORMAL HIGH (ref 70–99)
Potassium: 4.3 mmol/L (ref 3.5–5.1)
Sodium: 134 mmol/L — ABNORMAL LOW (ref 135–145)
Total Bilirubin: 0.9 mg/dL (ref 0.3–1.2)
Total Protein: 8.3 g/dL — ABNORMAL HIGH (ref 6.5–8.1)

## 2022-07-26 LAB — PROCALCITONIN: Procalcitonin: <0.1 ng/mL

## 2022-07-26 LAB — HIV ANTIBODY (ROUTINE TESTING W REFLEX): HIV Screen 4th Generation wRfx: NONREACTIVE

## 2022-07-26 LAB — PROTIME-INR
INR: 1.2 (ref 0.8–1.2)
Prothrombin Time: 15 seconds (ref 11.4–15.2)

## 2022-07-26 LAB — LACTIC ACID, PLASMA
Lactic Acid, Venous: 1.7 mmol/L (ref 0.5–1.9)
Lactic Acid, Venous: 1.6 mmol/L (ref 0.5–1.9)

## 2022-07-26 LAB — APTT: aPTT: 31 seconds (ref 24–36)

## 2022-07-26 LAB — POC URINE PREG, ED: Preg Test, Ur: NEGATIVE

## 2022-07-26 MED ORDER — ACETAMINOPHEN 325 MG PO TABS
650.0000 mg | ORAL_TABLET | Freq: Four times a day (QID) | ORAL | Status: DC | PRN
  Administered 2022-07-26 – 2022-07-28 (×5): 650 mg via ORAL
  Filled 2022-07-26 (×6): qty 2

## 2022-07-26 MED ORDER — OXYCODONE-ACETAMINOPHEN 5-325 MG PO TABS
1.0000 | ORAL_TABLET | Freq: Once | ORAL | Status: AC
  Administered 2022-07-26: 1 via ORAL
  Filled 2022-07-26: qty 1

## 2022-07-26 MED ORDER — KETOROLAC TROMETHAMINE 15 MG/ML IJ SOLN
15.0000 mg | Freq: Once | INTRAMUSCULAR | Status: AC
  Administered 2022-07-26: 15 mg via INTRAVENOUS
  Filled 2022-07-26: qty 1

## 2022-07-26 MED ORDER — IBUPROFEN 400 MG PO TABS
400.0000 mg | ORAL_TABLET | Freq: Once | ORAL | Status: AC
  Administered 2022-07-26: 400 mg via ORAL
  Filled 2022-07-26: qty 1

## 2022-07-26 MED ORDER — ONDANSETRON HCL 4 MG/2ML IJ SOLN
4.0000 mg | Freq: Once | INTRAMUSCULAR | Status: AC
  Administered 2022-07-26: 4 mg via INTRAVENOUS
  Filled 2022-07-26: qty 2

## 2022-07-26 MED ORDER — LACTATED RINGERS IV BOLUS
1000.0000 mL | Freq: Once | INTRAVENOUS | Status: AC
  Administered 2022-07-26: 1000 mL via INTRAVENOUS

## 2022-07-26 MED ORDER — ACETAMINOPHEN 325 MG PO TABS
650.0000 mg | ORAL_TABLET | Freq: Once | ORAL | Status: AC
  Administered 2022-07-26: 650 mg via ORAL
  Filled 2022-07-26: qty 2

## 2022-07-26 MED ORDER — OXYCODONE-ACETAMINOPHEN 5-325 MG PO TABS
1.0000 | ORAL_TABLET | ORAL | Status: DC | PRN
  Administered 2022-07-26 – 2022-07-29 (×8): 1 via ORAL
  Filled 2022-07-26 (×9): qty 1

## 2022-07-26 MED ORDER — ONDANSETRON 4 MG PO TBDP: 4.0000 mg | ORAL_TABLET | Freq: Three times a day (TID) | ORAL | 0 refills | Status: DC | PRN

## 2022-07-26 MED ORDER — MORPHINE SULFATE (PF) 2 MG/ML IV SOLN: 2.0000 mg | INTRAVENOUS | Status: DC | PRN

## 2022-07-26 MED ORDER — FENTANYL CITRATE PF 50 MCG/ML IJ SOSY
50.0000 ug | PREFILLED_SYRINGE | Freq: Once | INTRAMUSCULAR | Status: AC
  Administered 2022-07-26: 50 ug via INTRAVENOUS
  Filled 2022-07-26: qty 1

## 2022-07-26 MED ORDER — ONDANSETRON HCL 4 MG/2ML IJ SOLN
4.0000 mg | Freq: Three times a day (TID) | INTRAMUSCULAR | Status: DC | PRN
  Administered 2022-07-27 (×2): 4 mg via INTRAVENOUS
  Filled 2022-07-26 (×2): qty 2

## 2022-07-26 MED ORDER — OXYCODONE-ACETAMINOPHEN 5-325 MG PO TABS: 1.0000 | ORAL_TABLET | ORAL | 0 refills | Status: AC | PRN

## 2022-07-26 MED ORDER — SODIUM CHLORIDE 0.9 % IV SOLN: INTRAVENOUS | Status: DC

## 2022-07-26 MED ORDER — SULFAMETHOXAZOLE-TRIMETHOPRIM 800-160 MG PO TABS: 1.0000 | ORAL_TABLET | Freq: Two times a day (BID) | ORAL | 0 refills | Status: AC

## 2022-07-26 MED ORDER — SODIUM CHLORIDE 0.9 % IV SOLN
1.0000 g | INTRAVENOUS | Status: DC
  Administered 2022-07-27 – 2022-07-28 (×2): 1 g via INTRAVENOUS
  Filled 2022-07-26 (×2): qty 10

## 2022-07-26 MED ORDER — SODIUM CHLORIDE 0.9 % IV SOLN
1.0000 g | Freq: Once | INTRAVENOUS | Status: AC
  Administered 2022-07-26: 1 g via INTRAVENOUS
  Filled 2022-07-26: qty 10

## 2022-07-26 MED ORDER — TAMSULOSIN HCL 0.4 MG PO CAPS: 0.4000 mg | ORAL_CAPSULE | Freq: Every day | ORAL | 0 refills | Status: AC

## 2022-07-26 NOTE — ED Notes (Signed)
Patient seen for admission questions

## 2022-07-26 NOTE — Consult Note (Signed)
Urology Consult  I have been asked to see the patient by Dr. Jori Moll, for evaluation and management of left ureteral stone + UTI.  Chief Complaint: Left flank pain, dysuria, frequency, nausea  History of Present Illness: Tonya May is a 27 y.o. year old female who presented to the ED this morning with reports of sudden onset left flank pain, dysuria, frequency, and nausea overnight. She is accompanied by her mother at the bedside. Spanish interpreter offered but declined.  CT stone study revealed a 72mm distal left ureteral stone with mild left hydroureteronephrosis.  Admission labs notable for WBC count 21.3; creatinine 0.94 (baseline 0.7); UA with no nitrites, 11-20 RBCs/hpf, >50 WBCs/hpf, and rare bacteria; urine pregnancy negative.  Today she reports feeling slightly better since arrival this morning. She is afebrile but tachycardic to 126. On empiric antibiotics as below. She denies a personal or family history of nephrolithiasis, no known IBD; she admits to drinking a lot of coffee.  Anti-infectives (From admission, onward)    Start     Dose/Rate Route Frequency Ordered Stop   07/27/22 0800  cefTRIAXone (ROCEPHIN) 1 g in sodium chloride 0.9 % 100 mL IVPB        1 g 200 mL/hr over 30 Minutes Intravenous Every 24 hours 07/26/22 1019     07/26/22 0745  cefTRIAXone (ROCEPHIN) 1 g in sodium chloride 0.9 % 100 mL IVPB        1 g 200 mL/hr over 30 Minutes Intravenous  Once 07/26/22 0730 07/26/22 0830   07/26/22 0000  sulfamethoxazole-trimethoprim (BACTRIM DS) 800-160 MG tablet        1 tablet Oral 2 times daily 07/26/22 0809 08/05/22 2359        Past Medical History:  Diagnosis Date   No known health problems     Past Surgical History:  Procedure Laterality Date   INTRAUTERINE DEVICE (IUD) INSERTION  10/2017   [facility]   NO PAST SURGERIES      Home Medications:  Current Meds  Medication Sig   ondansetron (ZOFRAN-ODT) 4 MG disintegrating tablet Take 1 tablet (4 mg total)  by mouth every 8 (eight) hours as needed for nausea or vomiting.   oxyCODONE-acetaminophen (PERCOCET) 5-325 MG tablet Take 1 tablet by mouth every 4 (four) hours as needed for up to 5 days for severe pain.   sulfamethoxazole-trimethoprim (BACTRIM DS) 800-160 MG tablet Take 1 tablet by mouth 2 (two) times daily for 10 days.   tamsulosin (FLOMAX) 0.4 MG CAPS capsule Take 1 capsule (0.4 mg total) by mouth daily for 10 days.    Allergies: No Known Allergies  No family history on file.  Social History:  reports that she has never smoked. She has never used smokeless tobacco. She reports current alcohol use. She reports that she does not use drugs.  ROS: A complete review of systems was performed.  All systems are negative except for pertinent findings as noted.  Physical Exam:  Vital signs in last 24 hours: Temp:  [98.2 F (36.8 C)-99.1 F (37.3 C)] 99.1 F (37.3 C) (01/30 1010) Pulse Rate:  [88-126] 126 (01/30 1010) Resp:  [18-20] 18 (01/30 0949) BP: (131-153)/(61-86) 133/86 (01/30 0949) SpO2:  [97 %-100 %] 100 % (01/30 1010) Weight:  [69.4 kg] 69.4 kg (01/30 0600) Constitutional:  Alert and oriented, no acute distress HEENT: [place] AT, moist mucus membranes Cardiovascular: No clubbing, cyanosis, or edema Respiratory: Normal respiratory effort Skin: No rashes, bruises or suspicious lesions Neurologic: Grossly intact, no focal  deficits, moving all 4 extremities Psychiatric: Normal mood and affect  Laboratory Data:  Recent Labs    07/26/22 0602  WBC 21.3*  HGB 13.4  HCT 41.0   Recent Labs    07/26/22 0602  NA 134*  K 4.3  CL 104  CO2 23  GLUCOSE 119*  BUN 14  CREATININE 0.94  CALCIUM 9.0   Urinalysis    Component Value Date/Time   COLORURINE YELLOW (A) 07/26/2022 0624   APPEARANCEUR HAZY (A) 07/26/2022 0624   APPEARANCEUR Clear 12/09/2013 0929   LABSPEC 1.021 07/26/2022 0624   LABSPEC 1.026 12/09/2013 0929   PHURINE 6.0 07/26/2022 0624   GLUCOSEU NEGATIVE  07/26/2022 0624   GLUCOSEU Negative 12/09/2013 0929   HGBUR MODERATE (A) 07/26/2022 0624   BILIRUBINUR NEGATIVE 07/26/2022 0624   BILIRUBINUR Negative 12/09/2013 0929   KETONESUR 5 (A) 07/26/2022 0624   PROTEINUR 30 (A) 07/26/2022 0624   NITRITE NEGATIVE 07/26/2022 0624   LEUKOCYTESUR LARGE (A) 07/26/2022 0624   LEUKOCYTESUR Negative 12/09/2013 0929    Radiologic Imaging: CT Renal Stone Study  Result Date: 07/26/2022 CLINICAL DATA:  Left flank pain since last night EXAM: CT ABDOMEN AND PELVIS WITHOUT CONTRAST TECHNIQUE: Multidetector CT imaging of the abdomen and pelvis was performed following the standard protocol without IV contrast. RADIATION DOSE REDUCTION: This exam was performed according to the departmental dose-optimization program which includes automated exposure control, adjustment of the mA and/or kV according to patient size and/or use of iterative reconstruction technique. COMPARISON:  01/02/2019 FINDINGS: Lower chest:  No contributory findings. Hepatobiliary: Marked hepatic steatosis.No evidence of biliary obstruction or stone. Pancreas: Unremarkable. Spleen: Unremarkable. Adrenals/Urinary Tract: Negative adrenals. Left hydroureteronephrosis due to a punctate stone in the lower left ureter, just above the UVJ, marked on 2:89. No additional nephrolithiasis. Unremarkable bladder. Stomach/Bowel:  No obstruction. No visible bowel inflammation. Vascular/Lymphatic: No acute vascular abnormality. Trace atheromatous calcification at the right common iliac. No mass or adenopathy. Reproductive:No pathologic findings. Other: No ascites or pneumoperitoneum. Musculoskeletal: No acute abnormalities. IMPRESSION: 1. Mild left hydronephrosis from a punctate distal ureteral calculus. 2. Hepatic steatosis. Electronically Signed   By: Jorje Guild M.D.   On: 07/26/2022 07:21    Assessment & Plan:  27 year old female admitted with left renal colic and UTI with a 1 mm distal left ureteral stone,  meeting sepsis criteria with leukocytosis and tachycardia.  She has been started on empiric IV antibiotics and reports symptomatic improvement with supportive care.  We discussed that based on stone size, there is a very high likelihood that she will be able to spontaneously pass her stone.  Otherwise, treatment options would include left ureteral stent placement with plans for definitive stone management in 2 to 3 weeks after completion of appropriate antibiotic therapy assuming the stone did not pass on its own in the interim.  She is in agreement with a trial of passage and deferring ureteral stent for now.  Recommendations -Continue empiric antibiotics and follow cultures -Continue pain control and supportive care -Start Flomax 0.4mg  daily for MET -Contact urology with acute decompensation, may consider left ureteral stent placement if clinically worsening and/or with high fevers -Outpatient urology follow up in 3-4 weeks with UA and RUS prior  Thank you for involving me in this patient's care, please page with any further questions or concerns.  Debroah Loop, PA-C 07/26/2022 10:14 AM

## 2022-07-26 NOTE — ED Notes (Signed)
Patient transported to CT 

## 2022-07-26 NOTE — Progress Notes (Signed)
   07/26/22 1721  Assess: MEWS Score  Temp 99.5 F (37.5 C)  BP 113/69  MAP (mmHg) 82  Pulse Rate (!) 128  Resp 16  Level of Consciousness Alert  SpO2 98 %  O2 Device Room Air  Assess: MEWS Score  MEWS Temp 0  MEWS Systolic 0  MEWS Pulse 2  MEWS RR 0  MEWS LOC 0  MEWS Score 2  MEWS Score Color Yellow  Assess: if the MEWS score is Yellow or Red  Were vital signs taken at a resting state? Yes  Focused Assessment No change from prior assessment  Does the patient meet 2 or more of the SIRS criteria? Yes  Does the patient have a confirmed or suspected source of infection? Yes  Provider and Rapid Response Notified? Yes  MEWS guidelines implemented *See Row Information* No, previously yellow, continue vital signs every 4 hours  Assess: SIRS CRITERIA  SIRS Temperature  0  SIRS Pulse 1  SIRS Respirations  0  SIRS WBC 1  SIRS Score Sum  2   Pt transferred from ED; MD aware of HR, documented in note

## 2022-07-26 NOTE — Plan of Care (Signed)

## 2022-07-26 NOTE — H&P (View-Only) (Signed)
Urology Consult  I have been asked to see the patient by Dr. Jori Moll, for evaluation and management of left ureteral stone + UTI.  Chief Complaint: Left flank pain, dysuria, frequency, nausea  History of Present Illness: Tonya May is a 27 y.o. year old female who presented to the ED this morning with reports of sudden onset left flank pain, dysuria, frequency, and nausea overnight. She is accompanied by her mother at the bedside. Spanish interpreter offered but declined.  CT stone study revealed a 82mm distal left ureteral stone with mild left hydroureteronephrosis.  Admission labs notable for WBC count 21.3; creatinine 0.94 (baseline 0.7); UA with no nitrites, 11-20 RBCs/hpf, >50 WBCs/hpf, and rare bacteria; urine pregnancy negative.  Today she reports feeling slightly better since arrival this morning. She is afebrile but tachycardic to 126. On empiric antibiotics as below. She denies a personal or family history of nephrolithiasis, no known IBD; she admits to drinking a lot of coffee.  Anti-infectives (From admission, onward)    Start     Dose/Rate Route Frequency Ordered Stop   07/27/22 0800  cefTRIAXone (ROCEPHIN) 1 g in sodium chloride 0.9 % 100 mL IVPB        1 g 200 mL/hr over 30 Minutes Intravenous Every 24 hours 07/26/22 1019     07/26/22 0745  cefTRIAXone (ROCEPHIN) 1 g in sodium chloride 0.9 % 100 mL IVPB        1 g 200 mL/hr over 30 Minutes Intravenous  Once 07/26/22 0730 07/26/22 0830   07/26/22 0000  sulfamethoxazole-trimethoprim (BACTRIM DS) 800-160 MG tablet        1 tablet Oral 2 times daily 07/26/22 0809 08/05/22 2359        Past Medical History:  Diagnosis Date   No known health problems     Past Surgical History:  Procedure Laterality Date   INTRAUTERINE DEVICE (IUD) INSERTION  10/2017   [facility]   NO PAST SURGERIES      Home Medications:  Current Meds  Medication Sig   ondansetron (ZOFRAN-ODT) 4 MG disintegrating tablet Take 1 tablet (4 mg total)  by mouth every 8 (eight) hours as needed for nausea or vomiting.   oxyCODONE-acetaminophen (PERCOCET) 5-325 MG tablet Take 1 tablet by mouth every 4 (four) hours as needed for up to 5 days for severe pain.   sulfamethoxazole-trimethoprim (BACTRIM DS) 800-160 MG tablet Take 1 tablet by mouth 2 (two) times daily for 10 days.   tamsulosin (FLOMAX) 0.4 MG CAPS capsule Take 1 capsule (0.4 mg total) by mouth daily for 10 days.    Allergies: No Known Allergies  No family history on file.  Social History:  reports that she has never smoked. She has never used smokeless tobacco. She reports current alcohol use. She reports that she does not use drugs.  ROS: A complete review of systems was performed.  All systems are negative except for pertinent findings as noted.  Physical Exam:  Vital signs in last 24 hours: Temp:  [98.2 F (36.8 C)-99.1 F (37.3 C)] 99.1 F (37.3 C) (01/30 1010) Pulse Rate:  [88-126] 126 (01/30 1010) Resp:  [18-20] 18 (01/30 0949) BP: (131-153)/(61-86) 133/86 (01/30 0949) SpO2:  [97 %-100 %] 100 % (01/30 1010) Weight:  [69.4 kg] 69.4 kg (01/30 0600) Constitutional:  Alert and oriented, no acute distress HEENT: [place] AT, moist mucus membranes Cardiovascular: No clubbing, cyanosis, or edema Respiratory: Normal respiratory effort Skin: No rashes, bruises or suspicious lesions Neurologic: Grossly intact, no focal  deficits, moving all 4 extremities Psychiatric: Normal mood and affect  Laboratory Data:  Recent Labs    07/26/22 0602  WBC 21.3*  HGB 13.4  HCT 41.0   Recent Labs    07/26/22 0602  NA 134*  K 4.3  CL 104  CO2 23  GLUCOSE 119*  BUN 14  CREATININE 0.94  CALCIUM 9.0   Urinalysis    Component Value Date/Time   COLORURINE YELLOW (A) 07/26/2022 0624   APPEARANCEUR HAZY (A) 07/26/2022 0624   APPEARANCEUR Clear 12/09/2013 0929   LABSPEC 1.021 07/26/2022 0624   LABSPEC 1.026 12/09/2013 0929   PHURINE 6.0 07/26/2022 0624   GLUCOSEU NEGATIVE  07/26/2022 0624   GLUCOSEU Negative 12/09/2013 0929   HGBUR MODERATE (A) 07/26/2022 0624   BILIRUBINUR NEGATIVE 07/26/2022 0624   BILIRUBINUR Negative 12/09/2013 0929   KETONESUR 5 (A) 07/26/2022 0624   PROTEINUR 30 (A) 07/26/2022 0624   NITRITE NEGATIVE 07/26/2022 0624   LEUKOCYTESUR LARGE (A) 07/26/2022 0624   LEUKOCYTESUR Negative 12/09/2013 0929    Radiologic Imaging: CT Renal Stone Study  Result Date: 07/26/2022 CLINICAL DATA:  Left flank pain since last night EXAM: CT ABDOMEN AND PELVIS WITHOUT CONTRAST TECHNIQUE: Multidetector CT imaging of the abdomen and pelvis was performed following the standard protocol without IV contrast. RADIATION DOSE REDUCTION: This exam was performed according to the departmental dose-optimization program which includes automated exposure control, adjustment of the mA and/or kV according to patient size and/or use of iterative reconstruction technique. COMPARISON:  01/02/2019 FINDINGS: Lower chest:  No contributory findings. Hepatobiliary: Marked hepatic steatosis.No evidence of biliary obstruction or stone. Pancreas: Unremarkable. Spleen: Unremarkable. Adrenals/Urinary Tract: Negative adrenals. Left hydroureteronephrosis due to a punctate stone in the lower left ureter, just above the UVJ, marked on 2:89. No additional nephrolithiasis. Unremarkable bladder. Stomach/Bowel:  No obstruction. No visible bowel inflammation. Vascular/Lymphatic: No acute vascular abnormality. Trace atheromatous calcification at the right common iliac. No mass or adenopathy. Reproductive:No pathologic findings. Other: No ascites or pneumoperitoneum. Musculoskeletal: No acute abnormalities. IMPRESSION: 1. Mild left hydronephrosis from a punctate distal ureteral calculus. 2. Hepatic steatosis. Electronically Signed   By: Jorje Guild M.D.   On: 07/26/2022 07:21    Assessment & Plan:  27 year old female admitted with left renal colic and UTI with a 1 mm distal left ureteral stone,  meeting sepsis criteria with leukocytosis and tachycardia.  She has been started on empiric IV antibiotics and reports symptomatic improvement with supportive care.  We discussed that based on stone size, there is a very high likelihood that she will be able to spontaneously pass her stone.  Otherwise, treatment options would include left ureteral stent placement with plans for definitive stone management in 2 to 3 weeks after completion of appropriate antibiotic therapy assuming the stone did not pass on its own in the interim.  She is in agreement with a trial of passage and deferring ureteral stent for now.  Recommendations -Continue empiric antibiotics and follow cultures -Continue pain control and supportive care -Start Flomax 0.4mg  daily for MET -Contact urology with acute decompensation, may consider left ureteral stent placement if clinically worsening and/or with high fevers -Outpatient urology follow up in 3-4 weeks with UA and RUS prior  Thank you for involving me in this patient's care, please page with any further questions or concerns.  Debroah Loop, PA-C 07/26/2022 10:14 AM

## 2022-07-26 NOTE — H&P (Signed)
History and Physical    Tonya May JSH:[id number] DOB: [birth year] DOA: 07/26/2022  Referring MD/NP/PA:   PCP: Patient, No Pcp Per   Patient coming from:  The patient is coming from home.    Chief Complaint: left flank pain, dysuria  HPI: Tonya May is a 27 y.o. female with no other significant past medical history, who presents with left flank pain and dysuria.  Patient states that she has been having mild intermittent left flank pain for several months, which has been progressively worsening since last night.  The pain is constant, severe, sharp, radiating to the left abdomen area.  Associated with nausea and 1 episode of nonbilious nonbloody vomiting.  Patient does not have diarrhea.  She also has dysuria, burning on urination and increased urinary frequency.  No hematuria.  Denies chest pain, cough, shortness breath.  Patient has subjective and chills, but her temperature is 98.3 in ED.  Data reviewed independently and ED Course: pt was found to have WBC 21.3, positive urinalysis (hazy appearance, large amount of leukocyte, rare bacteria, WBC> 50), GFR> 60, negative pregnancy test.  Temperature 98.3, blood pressure 133/86, heart rate 110-120, RR 16.  Patient is admitted to telemetry bed as inpatient.  Dr. Glori Luis of urology is consulted.  CT of renal stone protocol: 1. Mild left hydronephrosis from a punctate distal ureteral calculus. 2. Hepatic steatosis.  EKG:  Not done in ED, will get one.     Review of Systems:   General: Has subjective fevers, chills, no body weight gain, has poor appetite, has fatigue HEENT: no blurry vision, hearing changes or sore throat Respiratory: no dyspnea, coughing, wheezing CV: no chest pain, no palpitations GI: has nausea, vomiting, left abdominal pain, no diarrhea, constipation GU: has dysuria, burning on urination, increased urinary frequency, no hematuria  Ext: no leg edema Neuro: no unilateral weakness, numbness, or tingling, no vision  change or hearing loss Skin: no rash, no skin tear. MSK: No muscle spasm, no deformity, no limitation of range of movement in spin. Has left  flank pain Heme: No easy bruising.  Travel history: No recent long distant travel.   Allergy: No Known Allergies  Past Medical History:  Diagnosis Date   No known health problems     Past Surgical History:  Procedure Laterality Date   INTRAUTERINE DEVICE (IUD) INSERTION  10/2017   [facility]   NO PAST SURGERIES      Social History:  reports that she has never smoked. She has never used smokeless tobacco. She reports current alcohol use. She reports that she does not use drugs.  Family History: History reviewed. No pertinent family history.  I have reviewed with patient about family medical history, but patient states that there is no significant family medical history to report.  Prior to Admission medications   Medication Sig Start Date End Date Taking? Authorizing Provider  ondansetron (ZOFRAN-ODT) 4 MG disintegrating tablet Take 1 tablet (4 mg total) by mouth every 8 (eight) hours as needed for nausea or vomiting. 07/26/22  Yes Mumma, Larene Beach, MD  oxyCODONE-acetaminophen (PERCOCET) 5-325 MG tablet Take 1 tablet by mouth every 4 (four) hours as needed for up to 5 days for severe pain. 07/26/22 07/31/22 Yes Mumma, Larene Beach, MD  sulfamethoxazole-trimethoprim (BACTRIM DS) 800-160 MG tablet Take 1 tablet by mouth 2 (two) times daily for 10 days. 07/26/22 08/05/22 Yes Mumma, Larene Beach, MD  tamsulosin (FLOMAX) 0.4 MG CAPS capsule Take 1 capsule (0.4 mg total) by mouth daily for 10 days. 07/26/22  08/05/22 Yes Mumma, Larene Beach, MD  HYDROcodone-acetaminophen (NORCO/VICODIN) 5-325 MG tablet Take 1 tablet by mouth every 4 (four) hours as needed. 01/02/19   Harvest Dark, MD  Levonorgestrel [facility]) 19.5 MG IUD by Intrauterine route.    [provider]  potassium chloride (K-DUR) 10 MEQ tablet Take 1 tablet (10 mEq total) by mouth 2 (two) times daily. 01/02/19    Harvest Dark, MD    Physical Exam: Vitals:   07/26/22 0601 07/26/22 0640 07/26/22 0949 07/26/22 1010  BP: (!) 153/61 131/80 133/86   Pulse: 88 (!) 114 (!) 117 (!) 126  Resp: 19 20 18    Temp: 98.2 F (36.8 C)  98.3 F (36.8 C) 99.1 F (37.3 C)  TempSrc: Oral  Oral Oral  SpO2: 100% 97% 100% 100%  Weight:      Height:       General: Not in acute distress HEENT:       Eyes: PERRL, EOMI, no scleral icterus.       ENT: No discharge from the ears and nose, no pharynx injection, no tonsillar enlargement.        Neck: No JVD, no bruit, no mass felt. Heme: No neck lymph node enlargement. Cardiac: S1/S2, RRR, No murmurs, No gallops or rubs. Respiratory: No rales, wheezing, rhonchi or rubs. GI: Soft, nondistended, has mild tenderness in the left abdomen, no rebound pain, no organomegaly, BS present. GU: Has positive left CVA tenderness Ext: No pitting leg edema bilaterally. 1+DP/PT pulse bilaterally. Musculoskeletal: No joint deformities, No joint redness or warmth, no limitation of ROM in spin. Skin: No rashes.  Neuro: Alert, oriented X3, cranial nerves II-XII grossly intact, moves all extremities normally. Psych: Patient is not psychotic, no suicidal or hemocidal ideation.  Labs on Admission: I have personally reviewed following labs and imaging studies  CBC: Recent Labs  Lab 07/26/22 0602  WBC 21.3*  NEUTROABS 15.8*  HGB 13.4  HCT 41.0  MCV 88.2  PLT 250   Basic Metabolic Panel: Recent Labs  Lab 07/26/22 0602  NA 134*  K 4.3  CL 104  CO2 23  GLUCOSE 119*  BUN 14  CREATININE 0.94  CALCIUM 9.0   GFR: Estimated Creatinine Clearance: 85.2 mL/min (by C-G formula based on SCr of 0.94 mg/dL). Liver Function Tests: Recent Labs  Lab 07/26/22 0602  AST 32  ALT 28  ALKPHOS 70  BILITOT 0.9  PROT 8.3*  ALBUMIN 4.4   No results for input(s): "LIPASE", "AMYLASE" in the last 168 hours. No results for input(s): "AMMONIA" in the last 168 hours. Coagulation  Profile: No results for input(s): "INR", "PROTIME" in the last 168 hours. Cardiac Enzymes: No results for input(s): "CKTOTAL", "CKMB", "CKMBINDEX", "TROPONINI" in the last 168 hours. BNP (last 3 results) No results for input(s): "PROBNP" in the last 8760 hours. HbA1C: No results for input(s): "HGBA1C" in the last 72 hours. CBG: No results for input(s): "GLUCAP" in the last 168 hours. Lipid Profile: No results for input(s): "CHOL", "HDL", "LDLCALC", "TRIG", "CHOLHDL", "LDLDIRECT" in the last 72 hours. Thyroid Function Tests: No results for input(s): "TSH", "T4TOTAL", "FREET4", "T3FREE", "THYROIDAB" in the last 72 hours. Anemia Panel: No results for input(s): "VITAMINB12", "FOLATE", "FERRITIN", "TIBC", "IRON", "RETICCTPCT" in the last 72 hours. Urine analysis:    Component Value Date/Time   COLORURINE YELLOW (A) 07/26/2022 0624   APPEARANCEUR HAZY (A) 07/26/2022 0624   APPEARANCEUR Clear 12/09/2013 0929   LABSPEC 1.021 07/26/2022 0624   LABSPEC 1.026 12/09/2013 0929   PHURINE 6.0 07/26/2022 0370  GLUCOSEU NEGATIVE 07/26/2022 0624   GLUCOSEU Negative 12/09/2013 0929   HGBUR MODERATE (A) 07/26/2022 0624   BILIRUBINUR NEGATIVE 07/26/2022 0624   BILIRUBINUR Negative 12/09/2013 0929   KETONESUR 5 (A) 07/26/2022 0624   PROTEINUR 30 (A) 07/26/2022 0624   NITRITE NEGATIVE 07/26/2022 0624   LEUKOCYTESUR LARGE (A) 07/26/2022 0624   LEUKOCYTESUR Negative 12/09/2013 0929   Sepsis Labs: @LABRCNTIP (procalcitonin:4,lacticidven:4) )No results found for this or any previous visit (from the past 240 hour(s)).   Radiological Exams on Admission: CT Renal Stone Study  Result Date: 07/26/2022 CLINICAL DATA:  Left flank pain since last night EXAM: CT ABDOMEN AND PELVIS WITHOUT CONTRAST TECHNIQUE: Multidetector CT imaging of the abdomen and pelvis was performed following the standard protocol without IV contrast. RADIATION DOSE REDUCTION: This exam was performed according to the departmental  dose-optimization program which includes automated exposure control, adjustment of the mA and/or kV according to patient size and/or use of iterative reconstruction technique. COMPARISON:  01/02/2019 FINDINGS: Lower chest:  No contributory findings. Hepatobiliary: Marked hepatic steatosis.No evidence of biliary obstruction or stone. Pancreas: Unremarkable. Spleen: Unremarkable. Adrenals/Urinary Tract: Negative adrenals. Left hydroureteronephrosis due to a punctate stone in the lower left ureter, just above the UVJ, marked on 2:89. No additional nephrolithiasis. Unremarkable bladder. Stomach/Bowel:  No obstruction. No visible bowel inflammation. Vascular/Lymphatic: No acute vascular abnormality. Trace atheromatous calcification at the right common iliac. No mass or adenopathy. Reproductive:No pathologic findings. Other: No ascites or pneumoperitoneum. Musculoskeletal: No acute abnormalities. IMPRESSION: 1. Mild left hydronephrosis from a punctate distal ureteral calculus. 2. Hepatic steatosis. Electronically Signed   By: 03/05/2019 M.D.   On: 07/26/2022 07:21      Assessment/Plan Principal Problem:   Acute pyelonephritis Active Problems:   Left ureteral stone   Sepsis (HCC)   Hydronephrosis of left kidney   Assessment and Plan:  Sepsis due to acute pyelonephritis and left ureteral stone and hydronephrosis of left kidney: Patient meets criteria for sepsis with WBC 21.3, heart rate 110-120s.  Currently hemodynamically stable.  Consulted Dr. 07/28/2022 of urology.   - Admit to tele bed as an inpatient -  Ceftriaxone by IV - Follow up results of urine and blood cx and amend antibiotic regimen if needed per sensitivity results - prn Zofran for nausea - prn morphine, Percocet, Tylenol - will get Procalcitonin and trend lactic acid levels per sepsis protocol. - IVF: 3L of LR bolus in ED, followed by 125 cc/h of NS     DVT ppx: SCD  Code Status: Full code  Family Communication: Yes,  patient's mother    at bed side.       by phone  Disposition Plan:  Anticipate discharge back to previous environment  Consults called:  Dr. Gabrielle Dare of urology  Admission status and Level of care: Telemetry Medical:   as inpt       Dispo: The patient is from: Home              Anticipated d/c is to: Home              Anticipated d/c date is: 2 days              Patient currently is not medically stable to d/c.    Severity of Illness:  The appropriate patient status for this patient is INPATIENT. Inpatient status is judged to be reasonable and necessary in order to provide the required intensity of service to ensure the patient's safety. The patient's presenting symptoms, physical exam findings,  and initial radiographic and laboratory data in the context of their chronic comorbidities is felt to place them at high risk for further clinical deterioration. Furthermore, it is not anticipated that the patient will be medically stable for discharge from the hospital within 2 midnights of admission.   * I certify that at the point of admission it is my clinical judgment that the patient will require inpatient hospital care spanning beyond 2 midnights from the point of admission due to high intensity of service, high risk for further deterioration and high frequency of surveillance required.*       Date of Service 07/26/2022    Ivor Costa [facility]   If 7PM-7AM, please contact night-coverage [email or web address] 07/26/2022, 10:51 AM

## 2022-07-26 NOTE — ED Triage Notes (Signed)
Ambulatory to triage with c/o left flank pain since 1030pm last night. Pain dull in nature. Endorses burning with urination, denies hematuria.

## 2022-07-26 NOTE — ED Provider Notes (Addendum)
[facility] Provider Note    Event Date/Time   First MD Initiated Contact with Patient 07/26/22 0700     (approximate)   History   Flank Pain   HPI  Tonya May is a 27 y.o. female with no significant past medical history who presents to the emergency department with left-sided flank pain.  States that she has been having some mild intermittent pain over the past couple of months but nothing has been an ongoing pain.  Sudden onset of pain last night at 1030 that has been ongoing since that time.  Left-sided flank pain.  Complaining of burning with urination and having to go more often.  Endorses some mild nausea but no episodes of vomiting.  Denies any fever or chills.  No prior history of kidney stones.  No history of a resistant urinary tract infection.     Physical Exam   Triage Vital Signs: ED Triage Vitals  Enc Vitals Group     BP 07/26/22 0601 (!) 153/61     Pulse Rate 07/26/22 0601 88     Resp 07/26/22 0601 19     Temp 07/26/22 0601 98.2 F (36.8 C)     Temp Source 07/26/22 0601 Oral     SpO2 07/26/22 0601 100 %     Weight 07/26/22 0600 153 lb (69.4 kg)     Height 07/26/22 0600 5\' 4"  (1.626 m)     Head Circumference --      Peak Flow --      Pain Score 07/26/22 0600 10     Pain Loc --      Pain Edu? --      Excl. in [place]? --     Most recent vital signs: Vitals:   07/26/22 0949 07/26/22 1010  BP: 133/86   Pulse: (!) 117 (!) 126  Resp: 18   Temp: 98.3 F (36.8 C) 99.1 F (37.3 C)  SpO2: 100% 100%    Physical Exam Constitutional:      General: She is in acute distress.     Appearance: She is well-developed.  HENT:     Head: Atraumatic.  Eyes:     Conjunctiva/sclera: Conjunctivae normal.  Cardiovascular:     Rate and Rhythm: Regular rhythm.  Pulmonary:     Effort: No respiratory distress.  Abdominal:     General: There is no distension.     Tenderness: There is left CVA tenderness.  Musculoskeletal:        General:  Normal range of motion.     Cervical back: Normal range of motion.  Skin:    General: Skin is warm.  Neurological:     Mental Status: She is alert. Mental status is at baseline.      IMPRESSION / MDM / ASSESSMENT AND PLAN / ED COURSE  I reviewed the triage vital signs and the nursing notes.  Differential diagnosis including pyelonephritis, kidney stone, infected kidney stone, diverticulitis   RADIOLOGY I independently reviewed imaging, my interpretation of imaging: CT scan with punctate distal kidney stone and mild left-sided hydronephrosis.   Labs (all labs ordered are listed, but only abnormal results are displayed) Labs interpreted as -   Labs concerning for infected kidney stone with elevated white count of 21.  UA concerning for an infectious process.  Pregnancy test is negative.  Kidney function at her baseline.  Labs Reviewed  CBC WITH DIFFERENTIAL/PLATELET - Abnormal; Notable for the following components:      Result  Value   WBC 21.3 (*)    Neutro Abs 15.8 (*)    Monocytes Absolute 1.7 (*)    Abs Immature Granulocytes 0.13 (*)    All other components within normal limits  COMPREHENSIVE METABOLIC PANEL - Abnormal; Notable for the following components:   Sodium 134 (*)    Glucose, Bld 119 (*)    Total Protein 8.3 (*)    All other components within normal limits  URINALYSIS, ROUTINE W REFLEX MICROSCOPIC - Abnormal; Notable for the following components:   Color, Urine YELLOW (*)    APPearance HAZY (*)    Hgb urine dipstick MODERATE (*)    Ketones, ur 5 (*)    Protein, ur 30 (*)    Leukocytes,Ua LARGE (*)    Bacteria, UA RARE (*)    All other components within normal limits  URINE CULTURE  CULTURE, BLOOD (ROUTINE X 2)  CULTURE, BLOOD (ROUTINE X 2)  LACTIC ACID, PLASMA  LACTIC ACID, PLASMA  PROTIME-INR  APTT  PROCALCITONIN  POC URINE PREG, ED      Patient treated with 2 L of IV fluids, IV Zofran, IV Rocephin, IV fentanyl and Toradol  Consulted and  discussed the patient's case with urology Dr. Franki Monte, he reviewed her images and felt that she should be able to pass the stone on her own within the next 24 hours.  Recommended Flomax and starting on Bactrim, urine culture and will follow-up with urologist in 1 week.  Patient had improvement of her pain.   10:17 AM  On reevaluation patient has persistent tachycardia.  Now stating that she is not feeling as well and is feeling nauseous like she might throw up.  Repeat temperature 99.1.  Patient has already received 2 L of fluids.  Sent a page to Dr. Franki Monte, and one of the PAs will come evaluate the patient today, no current plan for surgery.  Consulted hospitalist for admission.  PROCEDURES:  Critical Care performed: No  Procedures  Patient's presentation is most consistent with acute presentation with potential threat to life or bodily function.   MEDICATIONS ORDERED IN ED: Medications  fentaNYL (SUBLIMAZE) injection 50 mcg (50 mcg Intravenous Given 07/26/22 0636)  ondansetron (ZOFRAN) injection 4 mg (4 mg Intravenous Given 07/26/22 0636)  lactated ringers bolus 1,000 mL (0 mLs Intravenous Stopped 07/26/22 0756)  lactated ringers bolus 1,000 mL (1,000 mLs Intravenous Bolus 07/26/22 0830)  ondansetron (ZOFRAN) injection 4 mg (4 mg Intravenous Given 07/26/22 0752)  ketorolac (TORADOL) 15 MG/ML injection 15 mg (15 mg Intravenous Given 07/26/22 0753)  cefTRIAXone (ROCEPHIN) 1 g in sodium chloride 0.9 % 100 mL IVPB (0 g Intravenous Stopped 07/26/22 0830)  oxyCODONE-acetaminophen (PERCOCET/ROXICET) 5-325 MG per tablet 1 tablet (1 tablet Oral Given 07/26/22 0850)  acetaminophen (TYLENOL) tablet 650 mg (650 mg Oral Given 07/26/22 1012)    FINAL CLINICAL IMPRESSION(S) / ED DIAGNOSES   Final diagnoses:  Kidney stone  Pyelonephritis     Rx / DC Orders   ED Discharge Orders          Ordered    sulfamethoxazole-trimethoprim (BACTRIM DS) 800-160 MG tablet  2 times daily        07/26/22 0809     ondansetron (ZOFRAN-ODT) 4 MG disintegrating tablet  Every 8 hours PRN        07/26/22 0809    oxyCODONE-acetaminophen (PERCOCET) 5-325 MG tablet  Every 4 hours PRN        07/26/22 0809    tamsulosin (FLOMAX) 0.4 MG CAPS  capsule  Daily        07/26/22 0809             Note:  This document was prepared using Dragon voice recognition software and may include unintentional dictation errors.   Nathaniel Man, MD 07/26/22 7618    Nathaniel Man, MD 07/26/22 1017

## 2022-07-26 NOTE — ED Notes (Signed)
Pt rounded on and was found to have chills, temp rechecked with a result of 99.1 orally, MD aware, see new orders.

## 2022-07-27 DIAGNOSIS — N201 Calculus of ureter: Secondary | ICD-10-CM

## 2022-07-27 DIAGNOSIS — A419 Sepsis, unspecified organism: Secondary | ICD-10-CM | POA: Diagnosis not present

## 2022-07-27 DIAGNOSIS — N23 Unspecified renal colic: Secondary | ICD-10-CM

## 2022-07-27 DIAGNOSIS — N10 Acute tubulo-interstitial nephritis: Secondary | ICD-10-CM | POA: Diagnosis not present

## 2022-07-27 LAB — CBC
HCT: 34.4 % — ABNORMAL LOW (ref 36.0–46.0)
Hemoglobin: 11.1 g/dL — ABNORMAL LOW (ref 12.0–15.0)
MCH: 28.7 pg (ref 26.0–34.0)
MCHC: 32.3 g/dL (ref 30.0–36.0)
MCV: 88.9 fL (ref 80.0–100.0)
Platelets: 287 10*3/uL (ref 150–400)
RBC: 3.87 MIL/uL (ref 3.87–5.11)
RDW: 13.3 % (ref 11.5–15.5)
WBC: 23.7 10*3/uL — ABNORMAL HIGH (ref 4.0–10.5)
nRBC: 0 % (ref 0.0–0.2)

## 2022-07-27 LAB — BASIC METABOLIC PANEL
Anion gap: 5 (ref 5–15)
BUN: 7 mg/dL (ref 6–20)
CO2: 24 mmol/L (ref 22–32)
Calcium: 8.1 mg/dL — ABNORMAL LOW (ref 8.9–10.3)
Chloride: 108 mmol/L (ref 98–111)
Creatinine, Ser: 0.75 mg/dL (ref 0.44–1.00)
GFR, Estimated: >60 mL/min (ref >60)
Glucose, Bld: 113 mg/dL — ABNORMAL HIGH (ref 70–99)
Potassium: 3.5 mmol/L (ref 3.5–5.1)
Sodium: 137 mmol/L (ref 135–145)

## 2022-07-27 MED ORDER — ORAL CARE MOUTH RINSE: 15.0000 mL | OROMUCOSAL | Status: DC | PRN

## 2022-07-27 NOTE — Progress Notes (Signed)
[facility] Progress Note  Patient: Tonya May    TIR:[id number]  DOA: 07/26/2022     Date of Service: the patient was seen and examined on 07/27/2022  Chief Complaint  Patient presents with   Flank Pain   Brief hospital course: Tonya May is a 27 y.o. female with no other significant past medical history, who presents with left flank pain and dysuria.   Patient states that she has been having mild intermittent left flank pain for several months, which has been progressively worsening since last night.  The pain is constant, severe, sharp, radiating to the left abdomen area.  Associated with nausea and 1 episode of nonbilious nonbloody vomiting.  Patient does not have diarrhea.  She also has dysuria, burning on urination and increased urinary frequency.  No hematuria.  Denies chest pain, cough, shortness breath.  Patient has subjective and chills, but her temperature is 98.3 in ED.   Data reviewed independently and ED Course: pt was found to have WBC 21.3, positive urinalysis (hazy appearance, large amount of leukocyte, rare bacteria, WBC> 50), GFR> 60, negative pregnancy test.  Temperature 98.3, blood pressure 133/86, heart rate 110-120, RR 16.  Patient is admitted to telemetry bed as inpatient.  Dr. Glori May of urology is consulted.   CT of renal stone protocol: 1. Mild left hydronephrosis from a punctate distal ureteral calculus. 2. Hepatic steatosis.   Assessment and Plan: Sepsis due to acute pyelonephritis and left ureteral stone and hydronephrosis of left kidney: Patient meets criteria for sepsis with WBC 21.3, heart rate 110-120s.  Currently hemodynamically stable.  Consulted Dr. Glori May of urology. IVF: 3L of LR bolus in ED, followed by 125 cc/h of NS, decreased to NS 75 ml/hr Continue ceftriaxone 1 g IV Continue prn Zofran for nausea Continue prn morphine, Percocet, Tylenol Lactic acid 1.7, procalcitonin <0.1 Leukocytosis, WBC count 23k, continue to trend Blood culture  NGTD Urine culture 40 K Staph aureus, follow sensitivity report   Body mass index is 33.92 kg/m.  Interventions:       Diet: Regular diet DVT Prophylaxis: SCD,   Advance goals of care discussion: Full code  Family Communication: family was notpresent at bedside, at the time of interview.  The pt provided permission to discuss medical plan with the family. Opportunity was given to ask question and all questions were answered satisfactorily.   Disposition:  Pt is from Home, admitted with left pyelonephritis, still has low blood pressure and low-grade temperature, on IV antibiotics, urine culture pending, which precludes a safe discharge. Discharge to Home, when clinically stable, most likely in 1 to 2 days.  Subjective: No significant overnight events, patient's pain is under control 3/10 in the left flank area, had low-grade temperature in the morning, no any other active issues.  Denies any nausea vomiting, no chest pain or palpitation, no shortness of breath.  Physical Exam: General: NAD, lying comfortably Appear in no distress, affect appropriate Eyes: PERRLA ENT: Oral Mucosa Clear, moist  Neck: no JVD,  Cardiovascular: S1 and S2 Present, no Murmur,  Respiratory: good respiratory effort, Bilateral Air entry equal and Decreased, no Crackles, no wheezes Abdomen: Bowel Sound present, Soft, obese, mild left flank/CVA tenderness,  Skin: no rashes Extremities: no Pedal edema, no calf tenderness Neurologic: without any new focal findings Gait not checked due to patient safety concerns  Vitals:   07/26/22 2112 07/27/22 0452 07/27/22 0754 07/27/22 1420  BP: (!) 105/59 99/64 109/63 119/70  Pulse: (!) 119 92 (!) 105 Marland Kitchen)  109  Resp: 20 20 18    Temp: 98.5 F (36.9 C) 98 F (36.7 C) 100.2 F (37.9 C)   TempSrc:  Oral Oral   SpO2: 99% 96% 95% 100%  Weight:      Height:        Intake/Output Summary (Last 24 hours) at 07/27/2022 1543 Last data filed at 07/27/2022 0407 Gross per  24 hour  Intake 1310.38 ml  Output --  Net 1310.38 ml   Filed Weights   07/26/22 0600 07/26/22 1721  Weight: 69.4 kg 89.6 kg    Data Reviewed: I have personally reviewed and interpreted daily labs, tele strips, imagings as discussed above. I reviewed all nursing notes, pharmacy notes, vitals, pertinent old records I have discussed plan of care as described above with RN and patient/family.  CBC: Recent Labs  Lab 07/26/22 0602 07/27/22 0546  WBC 21.3* 23.7*  NEUTROABS 15.8*  --   HGB 13.4 11.1*  HCT 41.0 34.4*  MCV 88.2 88.9  PLT 366 595   Basic Metabolic Panel: Recent Labs  Lab 07/26/22 0602 07/27/22 0546  NA 134* 137  K 4.3 3.5  CL 104 108  CO2 23 24  GLUCOSE 119* 113*  BUN 14 7  CREATININE 0.94 0.75  CALCIUM 9.0 8.1*    Studies: No results found.  Scheduled Meds: Continuous Infusions:  sodium chloride 75 mL/hr at 07/27/22 1312   cefTRIAXone (ROCEPHIN)  IV 1 g (07/27/22 0818)   PRN Meds: acetaminophen, morphine injection, ondansetron (ZOFRAN) IV, mouth rinse, oxyCODONE-acetaminophen  Time spent: 35 minutes  Author: Val May. MD [facility] 07/27/2022 3:43 PM  To reach On-call, see care teams to locate the attending and reach out to them via [email or web address]. If 7PM-7AM, please contact night-coverage If you still have difficulty reaching the attending provider, please page the [facility] (Director on Call) for [facility] on amion for assistance.

## 2022-07-27 NOTE — Progress Notes (Signed)
Urology Consult Follow Up  Subjective: Patient resting in bed with mom at bedside.  She is reporting lower back pain, but she feels it is more due to the bed versus renal colic.  She is having urinary frequency and urgency, but she has not had any passage of a fragment.  She states her urine is yellow clear.    She is wanting to have more time to try to pass the stone on her own as she is hesitant to undergo ureteroscopy and have a stent in place.  VSS low grade fever   WBC count at 23.7.  Serum creatinine 0.75.  Preliminary blood cultures with no growth.  Urine culture pending.  Anti-infectives: Anti-infectives (From admission, onward)    Start     Dose/Rate Route Frequency Ordered Stop   07/27/22 0800  cefTRIAXone (ROCEPHIN) 1 g in sodium chloride 0.9 % 100 mL IVPB        1 g 200 mL/hr over 30 Minutes Intravenous Every 24 hours 07/26/22 1019     07/26/22 0745  cefTRIAXone (ROCEPHIN) 1 g in sodium chloride 0.9 % 100 mL IVPB        1 g 200 mL/hr over 30 Minutes Intravenous  Once 07/26/22 0730 07/26/22 0830   07/26/22 0000  sulfamethoxazole-trimethoprim (BACTRIM DS) 800-160 MG tablet        1 tablet Oral 2 times daily 07/26/22 0809 08/05/22 2359       Current Facility-Administered Medications  Medication Dose Route Frequency Provider Last Rate Last Admin   0.9 %  sodium chloride infusion   Intravenous Continuous Ivor Costa, MD 125 mL/hr at 07/27/22 0407 Infusion Verify at 07/27/22 0407   acetaminophen (TYLENOL) tablet 650 mg  650 mg Oral Q6H PRN Ivor Costa, MD   650 mg at 07/26/22 1608   cefTRIAXone (ROCEPHIN) 1 g in sodium chloride 0.9 % 100 mL IVPB  1 g Intravenous Q24H Ivor Costa, MD       morphine (PF) 2 MG/ML injection 2 mg  2 mg Intravenous Q4H PRN Ivor Costa, MD       ondansetron [facility]) injection 4 mg  4 mg Intravenous Q8H PRN Ivor Costa, MD       oxyCODONE-acetaminophen (PERCOCET/ROXICET) 5-325 MG per tablet 1 tablet  1 tablet Oral Q4H PRN Ivor Costa, MD   1 tablet at  07/27/22 6144     Objective: Vital signs in last 24 hours: Temp:  [98 F (36.7 C)-99.5 F (37.5 C)] 98 F (36.7 C) (01/31 0452) Pulse Rate:  [92-136] 92 (01/31 0452) Resp:  [16-20] 20 (01/31 0452) BP: (96-133)/(48-86) 99/64 (01/31 0452) SpO2:  [95 %-100 %] 96 % (01/31 0452) Weight:  [89.6 kg] 89.6 kg (01/30 1721)  Intake/Output from previous day: 01/30 0701 - 01/31 0700 In: 2310.4 [I.V.:1310.4; IV Piggyback:1000] Out: -  Intake/Output this shift: No intake/output data recorded.   Physical Exam Constitutional:      General: She is not in acute distress.    Appearance: Normal appearance. She is not ill-appearing, toxic-appearing or diaphoretic.  HENT:     Head: Normocephalic and atraumatic.     Nose: Nose normal.     Mouth/Throat:     Mouth: Mucous membranes are dry.     Pharynx: Oropharynx is clear.  Eyes:     Extraocular Movements: Extraocular movements intact.     Conjunctiva/sclera: Conjunctivae normal.     Pupils: Pupils are equal, round, and reactive to light.  Pulmonary:     Effort: Pulmonary effort is  normal.  Abdominal:     General: Abdomen is flat.     Palpations: Abdomen is soft.  Musculoskeletal:        General: Normal range of motion.     Cervical back: Normal range of motion and neck supple.  Skin:    General: Skin is warm and dry.  Neurological:     General: No focal deficit present.     Mental Status: She is alert. Mental status is at baseline. She is disoriented.  Psychiatric:        Mood and Affect: Mood normal.        Behavior: Behavior normal.        Thought Content: Thought content normal.        Judgment: Judgment normal.     Lab Results:  Recent Labs    07/26/22 0602 07/27/22 0546  WBC 21.3* 23.7*  HGB 13.4 11.1*  HCT 41.0 34.4*  PLT 366 287   BMET Recent Labs    07/26/22 0602 07/27/22 0546  NA 134* 137  K 4.3 3.5  CL 104 108  CO2 23 24  GLUCOSE 119* 113*  BUN 14 7  CREATININE 0.94 0.75  CALCIUM 9.0 8.1*    PT/INR Recent Labs    07/26/22 1254  LABPROT 15.0  INR 1.2   ABG No results for input(s): "PHART", "HCO3" in the last 72 hours.  Invalid input(s): "PCO2", "PO2"  Studies/Results: CT Renal Stone Study  Result Date: 07/26/2022 CLINICAL DATA:  Left flank pain since last night EXAM: CT ABDOMEN AND PELVIS WITHOUT CONTRAST TECHNIQUE: Multidetector CT imaging of the abdomen and pelvis was performed following the standard protocol without IV contrast. RADIATION DOSE REDUCTION: This exam was performed according to the departmental dose-optimization program which includes automated exposure control, adjustment of the mA and/or kV according to patient size and/or use of iterative reconstruction technique. COMPARISON:  01/02/2019 FINDINGS: Lower chest:  No contributory findings. Hepatobiliary: Marked hepatic steatosis.No evidence of biliary obstruction or stone. Pancreas: Unremarkable. Spleen: Unremarkable. Adrenals/Urinary Tract: Negative adrenals. Left hydroureteronephrosis due to a punctate stone in the lower left ureter, just above the UVJ, marked on 2:89. No additional nephrolithiasis. Unremarkable bladder. Stomach/Bowel:  No obstruction. No visible bowel inflammation. Vascular/Lymphatic: No acute vascular abnormality. Trace atheromatous calcification at the right common iliac. No mass or adenopathy. Reproductive:No pathologic findings. Other: No ascites or pneumoperitoneum. Musculoskeletal: No acute abnormalities. IMPRESSION: 1. Mild left hydronephrosis from a punctate distal ureteral calculus. 2. Hepatic steatosis. Electronically Signed   By: Jorje Guild M.D.   On: 07/26/2022 07:21     Assessment and Plan: 27 year old female who was admitted for possible sepsis with left renal colic, UTI and a 1 mm distal left ureteral stone.  She continues on empiric IV antibiotics.  She states that her pain is controlled with Percocet.    She would like to have the opportunity to try to pass this 1 mm  stone on her own.  She is stable for discharge from urological standpoint.  We will schedule her for an appointment early next week with Dr. Diamantina [facility] for recheck on her urine and symptoms.     LOS: 1 day    [facility] 07/27/2022

## 2022-07-28 ENCOUNTER — Encounter: Payer: Self-pay | Admitting: Internal Medicine

## 2022-07-28 ENCOUNTER — Inpatient Hospital Stay: Payer: Medicaid Other | Admitting: Certified Registered"

## 2022-07-28 ENCOUNTER — Inpatient Hospital Stay: Payer: Medicaid Other

## 2022-07-28 ENCOUNTER — Encounter
Admission: EM | Disposition: A | Discharge status: Home / Self Care | Payer: Self-pay | Source: Home / Self Care | Attending: Student

## 2022-07-28 DIAGNOSIS — N10 Acute tubulo-interstitial nephritis: Secondary | ICD-10-CM | POA: Diagnosis not present

## 2022-07-28 DIAGNOSIS — E669 Obesity, unspecified: Secondary | ICD-10-CM | POA: Diagnosis not present

## 2022-07-28 DIAGNOSIS — N12 Tubulo-interstitial nephritis, not specified as acute or chronic: Secondary | ICD-10-CM | POA: Diagnosis not present

## 2022-07-28 DIAGNOSIS — Z6833 Body mass index (BMI) 33.0-33.9, adult: Secondary | ICD-10-CM | POA: Diagnosis not present

## 2022-07-28 DIAGNOSIS — N132 Hydronephrosis with renal and ureteral calculous obstruction: Secondary | ICD-10-CM | POA: Diagnosis not present

## 2022-07-28 DIAGNOSIS — K828 Other specified diseases of gallbladder: Secondary | ICD-10-CM | POA: Diagnosis not present

## 2022-07-28 DIAGNOSIS — Z419 Encounter for procedure for purposes other than remedying health state, unspecified: Secondary | ICD-10-CM | POA: Diagnosis not present

## 2022-07-28 DIAGNOSIS — K760 Fatty (change of) liver, not elsewhere classified: Secondary | ICD-10-CM | POA: Diagnosis not present

## 2022-07-28 DIAGNOSIS — N201 Calculus of ureter: Secondary | ICD-10-CM | POA: Diagnosis not present

## 2022-07-28 HISTORY — PX: CYSTOSCOPY W/ RETROGRADES: SHX1426

## 2022-07-28 HISTORY — PX: CYSTOSCOPY/URETEROSCOPY/HOLMIUM LASER/STENT PLACEMENT: SHX6546

## 2022-07-28 LAB — BASIC METABOLIC PANEL
Anion gap: 7 (ref 5–15)
BUN: 6 mg/dL (ref 6–20)
CO2: 20 mmol/L — ABNORMAL LOW (ref 22–32)
Calcium: 8.3 mg/dL — ABNORMAL LOW (ref 8.9–10.3)
Chloride: 109 mmol/L (ref 98–111)
Creatinine, Ser: 0.67 mg/dL (ref 0.44–1.00)
GFR, Estimated: >60 mL/min (ref >60)
Glucose, Bld: 106 mg/dL — ABNORMAL HIGH (ref 70–99)
Potassium: 3.4 mmol/L — ABNORMAL LOW (ref 3.5–5.1)
Sodium: 136 mmol/L (ref 135–145)

## 2022-07-28 LAB — URINE CULTURE: Culture: 40000 — AB

## 2022-07-28 LAB — MISC LABCORP TEST (SEND OUT): Labcorp test code: 81950

## 2022-07-28 LAB — CBC
HCT: 31.3 % — ABNORMAL LOW (ref 36.0–46.0)
Hemoglobin: 10.3 g/dL — ABNORMAL LOW (ref 12.0–15.0)
MCH: 28.7 pg (ref 26.0–34.0)
MCHC: 32.9 g/dL (ref 30.0–36.0)
MCV: 87.2 fL (ref 80.0–100.0)
Platelets: 296 10*3/uL (ref 150–400)
RBC: 3.59 MIL/uL — ABNORMAL LOW (ref 3.87–5.11)
RDW: 13.2 % (ref 11.5–15.5)
WBC: 18.8 10*3/uL — ABNORMAL HIGH (ref 4.0–10.5)
nRBC: 0 % (ref 0.0–0.2)

## 2022-07-28 LAB — MAGNESIUM: Magnesium: 1.9 mg/dL (ref 1.7–2.4)

## 2022-07-28 LAB — PHOSPHORUS: Phosphorus: 2.2 mg/dL — ABNORMAL LOW (ref 2.5–4.6)

## 2022-07-28 SURGERY — CYSTOSCOPY/URETEROSCOPY/HOLMIUM LASER/STENT PLACEMENT
Anesthesia: General | Laterality: Left

## 2022-07-28 MED ORDER — MIDAZOLAM HCL 2 MG/2ML IJ SOLN
INTRAMUSCULAR | Status: AC
  Filled 2022-07-28: qty 2

## 2022-07-28 MED ORDER — OXYCODONE HCL 5 MG/5ML PO SOLN: 5.0000 mg | Freq: Once | ORAL | Status: AC | PRN

## 2022-07-28 MED ORDER — DEXMEDETOMIDINE HCL IN NACL 80 MCG/20ML IV SOLN
INTRAVENOUS | Status: AC
  Filled 2022-07-28: qty 20

## 2022-07-28 MED ORDER — DEXMEDETOMIDINE HCL IN NACL 80 MCG/20ML IV SOLN
INTRAVENOUS | Status: DC | PRN
  Administered 2022-07-28: 8 ug via INTRAVENOUS

## 2022-07-28 MED ORDER — LIDOCAINE HCL (PF) 2 % IJ SOLN
INTRAMUSCULAR | Status: AC
  Filled 2022-07-28: qty 5

## 2022-07-28 MED ORDER — MIDAZOLAM HCL 5 MG/5ML IJ SOLN
INTRAMUSCULAR | Status: DC | PRN
  Administered 2022-07-28: 2 mg via INTRAVENOUS

## 2022-07-28 MED ORDER — LIDOCAINE HCL (PF) 2 % IJ SOLN
INTRAMUSCULAR | Status: DC | PRN
  Administered 2022-07-28: 100 mg

## 2022-07-28 MED ORDER — PROPOFOL 10 MG/ML IV BOLUS
INTRAVENOUS | Status: AC
  Filled 2022-07-28: qty 20

## 2022-07-28 MED ORDER — OXYCODONE HCL 5 MG PO TABS
5.0000 mg | ORAL_TABLET | Freq: Once | ORAL | Status: AC | PRN
  Administered 2022-07-28: 5 mg via ORAL

## 2022-07-28 MED ORDER — LIDOCAINE HCL URETHRAL/MUCOSAL 2 % EX GEL
CUTANEOUS | Status: DC | PRN
  Administered 2022-07-28: 1 via URETHRAL

## 2022-07-28 MED ORDER — LIDOCAINE HCL URETHRAL/MUCOSAL 2 % EX GEL
CUTANEOUS | Status: AC
  Filled 2022-07-28: qty 10

## 2022-07-28 MED ORDER — K PHOS MONO-SOD PHOS DI & MONO 155-852-130 MG PO TABS
500.0000 mg | ORAL_TABLET | Freq: Four times a day (QID) | ORAL | Status: AC
  Administered 2022-07-28 (×2): 500 mg via ORAL
  Filled 2022-07-28 (×2): qty 2

## 2022-07-28 MED ORDER — CIPROFLOXACIN IN D5W 400 MG/200ML IV SOLN
INTRAVENOUS | Status: AC
  Filled 2022-07-28: qty 200

## 2022-07-28 MED ORDER — HYDROMORPHONE HCL 1 MG/ML IJ SOLN: 0.2500 mg | INTRAMUSCULAR | Status: DC | PRN

## 2022-07-28 MED ORDER — IOHEXOL 180 MG/ML  SOLN
INTRAMUSCULAR | Status: DC | PRN
  Administered 2022-07-28: 10 mL

## 2022-07-28 MED ORDER — PROPOFOL 10 MG/ML IV BOLUS
INTRAVENOUS | Status: DC | PRN
  Administered 2022-07-28: 200 mg via INTRAVENOUS

## 2022-07-28 MED ORDER — CEFAZOLIN SODIUM-DEXTROSE 1-4 GM/50ML-% IV SOLN
1.0000 g | Freq: Three times a day (TID) | INTRAVENOUS | Status: DC
  Administered 2022-07-28 – 2022-07-29 (×3): 1 g via INTRAVENOUS
  Filled 2022-07-28 (×4): qty 50

## 2022-07-28 MED ORDER — CIPROFLOXACIN IN D5W 400 MG/200ML IV SOLN
400.0000 mg | Freq: Once | INTRAVENOUS | Status: AC
  Administered 2022-07-28: 400 mg via INTRAVENOUS

## 2022-07-28 MED ORDER — SODIUM CHLORIDE 0.9 % IR SOLN
Status: DC | PRN
  Administered 2022-07-28: 3000 mL

## 2022-07-28 MED ORDER — ONDANSETRON HCL 4 MG/2ML IJ SOLN
INTRAMUSCULAR | Status: DC | PRN
  Administered 2022-07-28: 4 mg via INTRAVENOUS

## 2022-07-28 MED ORDER — KETOROLAC TROMETHAMINE 30 MG/ML IJ SOLN
INTRAMUSCULAR | Status: AC
  Filled 2022-07-28: qty 1

## 2022-07-28 MED ORDER — GLYCOPYRROLATE 0.2 MG/ML IJ SOLN
INTRAMUSCULAR | Status: DC | PRN
  Administered 2022-07-28: .2 mg via INTRAVENOUS

## 2022-07-28 MED ORDER — FENTANYL CITRATE (PF) 100 MCG/2ML IJ SOLN
INTRAMUSCULAR | Status: AC
  Filled 2022-07-28: qty 2

## 2022-07-28 MED ORDER — FENTANYL CITRATE (PF) 100 MCG/2ML IJ SOLN
INTRAMUSCULAR | Status: DC | PRN
  Administered 2022-07-28: 100 ug via INTRAVENOUS

## 2022-07-28 MED ORDER — DEXAMETHASONE SODIUM PHOSPHATE 10 MG/ML IJ SOLN
INTRAMUSCULAR | Status: DC | PRN
  Administered 2022-07-28: 10 mg via INTRAVENOUS

## 2022-07-28 MED ORDER — OXYCODONE HCL 5 MG PO TABS
ORAL_TABLET | ORAL | Status: AC
  Filled 2022-07-28: qty 1

## 2022-07-28 MED ORDER — DEXAMETHASONE SODIUM PHOSPHATE 10 MG/ML IJ SOLN
INTRAMUSCULAR | Status: AC
  Filled 2022-07-28: qty 1

## 2022-07-28 SURGICAL SUPPLY — 30 items
ADHESIVE MASTISOL STRL (MISCELLANEOUS) IMPLANT
BAG DRAIN SIEMENS DORNER NS (MISCELLANEOUS) ×2 IMPLANT
BAG PRESSURE INF REUSE 3000 (BAG) ×2 IMPLANT
BASKET ZERO TIP 1.9FR (BASKET) IMPLANT
BRUSH SCRUB EZ 1% IODOPHOR (MISCELLANEOUS) ×2 IMPLANT
CATH URET FLEX-TIP 2 LUMEN 10F (CATHETERS) IMPLANT
CATH URETL OPEN 5X70 (CATHETERS) IMPLANT
CNTNR URN SCR LID CUP LEK RST (MISCELLANEOUS) IMPLANT
CONT SPEC 4OZ STRL OR WHT (MISCELLANEOUS)
DRAPE UTILITY 15X26 TOWEL STRL (DRAPES) ×2 IMPLANT
DRSG TEGADERM 2-3/8X2-3/4 SM (GAUZE/BANDAGES/DRESSINGS) IMPLANT
FIBER LASER MOSES 200 DFL (Laser) IMPLANT
GLOVE SURG UNDER POLY LF SZ7.5 (GLOVE) ×2 IMPLANT
GOWN STRL REUS W/ TWL LRG LVL3 (GOWN DISPOSABLE) ×2 IMPLANT
GOWN STRL REUS W/ TWL XL LVL3 (GOWN DISPOSABLE) ×2 IMPLANT
GOWN STRL REUS W/TWL LRG LVL3 (GOWN DISPOSABLE) ×2
GOWN STRL REUS W/TWL XL LVL3 (GOWN DISPOSABLE) ×2
GUIDEWIRE STR DUAL SENSOR (WIRE) ×2 IMPLANT
IV NS IRRIG 3000ML ARTHROMATIC (IV SOLUTION) ×2 IMPLANT
KIT TURNOVER CYSTO (KITS) ×2 IMPLANT
PACK CYSTO AR (MISCELLANEOUS) ×2 IMPLANT
SET CYSTO W/LG BORE CLAMP LF (SET/KITS/TRAYS/PACK) ×2 IMPLANT
SHEATH NAVIGATOR HD 12/14X36 (SHEATH) IMPLANT
STENT URET 6FRX24 CONTOUR (STENTS) IMPLANT
STENT URET 6FRX26 CONTOUR (STENTS) IMPLANT
SURGILUBE 2OZ TUBE FLIPTOP (MISCELLANEOUS) ×2 IMPLANT
SYR 10ML LL (SYRINGE) ×2 IMPLANT
TRAP FLUID SMOKE EVACUATOR (MISCELLANEOUS) ×2 IMPLANT
VALVE UROSEAL ADJ ENDO (VALVE) IMPLANT
WATER STERILE IRR 500ML POUR (IV SOLUTION) ×2 IMPLANT

## 2022-07-28 NOTE — TOC CM/SW Note (Signed)
  Transition of Care (TOC) Screening Note   Patient Details  Name: Tonya May Date of Birth: 1996-01-07   Transition of Care [facility]) CM/SW Contact:    Candie Chroman, LCSW Phone Number: 07/28/2022, 8:45 AM    Transition of Care Department [facility]) has reviewed patient and no TOC needs have been identified at this time. We will continue to monitor patient advancement through interdisciplinary progression rounds. If new patient transition needs arise, please place a TOC consult.

## 2022-07-28 NOTE — Progress Notes (Signed)
Patient returned to unit s/p Left ureteroscopy with stent placement, vs within normal parameters endorses no pain.

## 2022-07-28 NOTE — Transfer of Care (Signed)
Immediate Anesthesia Transfer of Care Note  Patient: Tonya May  Procedure(s) Performed: CYSTOSCOPY/URETEROSCOPY/HOLMIUM LASER/STENT PLACEMENT (Left) CYSTOSCOPY WITH RETROGRADE PYELOGRAM  Patient Location: PACU  Anesthesia Type:General  Level of Consciousness: sedated  Airway & Oxygen Therapy: Patient Spontanous Breathing and Patient connected to face mask oxygen  Post-op Assessment: Report given to RN and Post -op Vital signs reviewed and stable  Post vital signs: Reviewed  Last Vitals:  Vitals Value Taken Time  BP 92/43 07/28/22 1216  Temp    Pulse 93 07/28/22 1218  Resp 20 07/28/22 1218  SpO2 99 % 07/28/22 1218  Vitals shown include unvalidated device data.  Last Pain:  Vitals:   07/28/22 1111  TempSrc: Temporal  PainSc: 0-No pain      Patients Stated Pain Goal: 2 (00/92/33 0076)  Complications: No notable events documented.

## 2022-07-28 NOTE — Op Note (Signed)
Date of procedure: 07/28/22  Preoperative diagnosis:  Left distal ureteral stone  Postoperative diagnosis:  Same  Procedure: Cystoscopy, left ureteroscopy, laser lithotripsy, left retrograde pyelogram with intraoperative interpretation, left ureteral stent placement  Surgeon: Nickolas Madrid, MD  Anesthesia: General  Complications: None  Intraoperative findings:  Normal bladder, uncomplicated basket extraction of 2 mm left distal ureteral stone with stent placement  EBL: Minimal  Specimens: None  Drains: Left 6 French by 24 cm ureteral stent  Indication: Tonya May is a 27 y.o. patient who originally presented on 07/26/2022 with left-sided flank pain and CT showing a 2 mm left distal ureteral stone, equivocal UA, leukocytosis, and low-grade fevers.  She was offered stent placement but opted for admission with antibiotics and close observation with trial of medical expulsive therapy with her small stone and stable vital signs.  She has had persistent lower abdominal pain and low-grade fever, urine culture ultimately grew 40k Staph aureus, CT this morning showed persistent distal ureteral stone and she opted for ureteroscopy.  After reviewing the management options for treatment, they elected to proceed with the above surgical procedure(s). We have discussed the potential benefits and risks of the procedure, side effects of the proposed treatment, the likelihood of the patient achieving the goals of the procedure, and any potential problems that might occur during the procedure or recuperation. Informed consent has been obtained.  Description of procedure:  The patient was taken to the operating room and general anesthesia was induced. SCDs were placed for DVT prophylaxis. The patient was placed in the dorsal lithotomy position, prepped and draped in the usual sterile fashion, and preoperative antibiotics(Cipro) were administered. A preoperative time-out was performed.   A 21 French  rigid cystoscope was used to intubate the urethra and thorough cystoscopy was performed.  The bladder was grossly normal throughout.  A sensor wire was passed into the left ureteral orifice and passed up to the kidney under fluoroscopic vision.  A semirigid ureteroscope was advanced alongside the wire and I met resistance in the distal ureter which was extremely tight.  A second sensor wire was advanced up to the kidney and using the train track technique I was able to advance the ureteroscope up to the level of the stone in the distal ureter.  There was a yellow crystalline 2 mm stone identified.  A 0 tip basket was used to grasp the stone and remove it intact from the ureter.  The ureteroscope was reinserted and no other stones were seen, there was mild to moderate left distal ureteral edema.  A retrograde pyelogram was performed from the mid ureter and showed no extravasation or filling defects.  A 6 French by 24 cm ureteral stent was placed fluoroscopically with a curl in the renal pelvis, as well as in the bladder.  Rigid cystoscopy was performed and the stone fragment was irrigated free and the bladder was drained.  Lidocaine jelly was instilled into the bladder.  Disposition: Stable to PACU  Plan: Recommend 10 to 14 days culture appropriate antibiotics Okay to use oxybutynin 10 mg XL daily for bladder spasms/urgency/frequency Will coordinate stent removal in clinic next week  Nickolas Madrid, MD

## 2022-07-28 NOTE — Progress Notes (Signed)
Urology Consult Follow Up  Subjective: She states that she had episodes of chills and fevers associated with headache overnight.  She is not having any further abdominal pain since yesterday.  There has not been capture of the fragment.  Currently VSS afebrile.  WBC count trending downward.  Urine culture pulmonary Staph aureus sensitivities are still pending.  Serum creatinine still pending this am.   Anti-infectives: Anti-infectives (From admission, onward)    Start     Dose/Rate Route Frequency Ordered Stop   07/27/22 0800  cefTRIAXone (ROCEPHIN) 1 g in sodium chloride 0.9 % 100 mL IVPB        1 g 200 mL/hr over 30 Minutes Intravenous Every 24 hours 07/26/22 1019     07/26/22 0745  cefTRIAXone (ROCEPHIN) 1 g in sodium chloride 0.9 % 100 mL IVPB        1 g 200 mL/hr over 30 Minutes Intravenous  Once 07/26/22 0730 07/26/22 0830   07/26/22 0000  sulfamethoxazole-trimethoprim (BACTRIM DS) 800-160 MG tablet        1 tablet Oral 2 times daily 07/26/22 0809 08/05/22 2359       Current Facility-Administered Medications  Medication Dose Route Frequency Provider Last Rate Last Admin   0.9 %  sodium chloride infusion   Intravenous Continuous Val Riles, MD 75 mL/hr at 07/28/22 0444 New Bag at 07/28/22 0444   acetaminophen (TYLENOL) tablet 650 mg  650 mg Oral Q6H PRN Ivor Costa, MD   650 mg at 07/28/22 0314   cefTRIAXone (ROCEPHIN) 1 g in sodium chloride 0.9 % 100 mL IVPB  1 g Intravenous Q24H Ivor Costa, MD 200 mL/hr at 07/27/22 0818 1 g at 07/27/22 0818   morphine (PF) 2 MG/ML injection 2 mg  2 mg Intravenous Q4H PRN Ivor Costa, MD       ondansetron [facility]) injection 4 mg  4 mg Intravenous Q8H PRN Ivor Costa, MD   4 mg at 07/27/22 2039   Oral care mouth rinse  15 mL Mouth Rinse PRN Val Riles, MD       oxyCODONE-acetaminophen (PERCOCET/ROXICET) 5-325 MG per tablet 1 tablet  1 tablet Oral Q4H PRN Ivor Costa, MD   1 tablet at 07/28/22 0559     Objective: Vital signs in last 24  hours: Temp:  [98.2 F (36.8 C)-100.2 F (37.9 C)] 98.2 F (36.8 C) (02/01 0310) Pulse Rate:  [100-109] 102 (02/01 0310) Resp:  [18-20] 20 (02/01 0310) BP: (106-119)/(60-78) 113/62 (02/01 0310) SpO2:  [95 %-100 %] 99 % (02/01 0310)  Intake/Output from previous day: 01/31 0701 - 02/01 0700 In: 1483.3 [P.O.:120; I.V.:1263.3; IV Piggyback:100] Out: -  Intake/Output this shift: No intake/output data recorded.   Physical Exam Constitutional:      General: She is not in acute distress.    Appearance: Normal appearance. She is not ill-appearing or toxic-appearing.  HENT:     Head: Normocephalic and atraumatic.     Nose: Nose normal.     Mouth/Throat:     Mouth: Mucous membranes are moist.  Eyes:     Pupils: Pupils are equal, round, and reactive to light.  Pulmonary:     Effort: Pulmonary effort is normal.  Abdominal:     General: Abdomen is flat.     Palpations: Abdomen is soft.  Musculoskeletal:        General: Normal range of motion.     Cervical back: Normal range of motion.  Skin:    General: Skin is warm and dry.  Neurological:     General: No focal deficit present.     Mental Status: She is alert and oriented to person, place, and time.  Psychiatric:        Behavior: Behavior normal.     Lab Results:  Recent Labs    07/27/22 0546 07/28/22 0622  WBC 23.7* 18.8*  HGB 11.1* 10.3*  HCT 34.4* 31.3*  PLT 287 296   BMET Recent Labs    07/26/22 0602 07/27/22 0546  NA 134* 137  K 4.3 3.5  CL 104 108  CO2 23 24  GLUCOSE 119* 113*  BUN 14 7  CREATININE 0.94 0.75  CALCIUM 9.0 8.1*   PT/INR Recent Labs    07/26/22 1254  LABPROT 15.0  INR 1.2   ABG No results for input(s): "PHART", "HCO3" in the last 72 hours.  Invalid input(s): "PCO2", "PO2"  Studies/Results: No results found.   Assessment and Plan: 27 year old female who is currently admitted for IV antibiotics and pain control a left distal punctate stone associated with fevers.  WBC count  this morning has trended downward to 18.8.  Serum creatinine is still pending.  Preliminary urine culture growing Staph aureus.  Blood cultures are negative growth to date.  Had chills, fevers and headaches overnight.  Abdominal pain has resolved, but no capture of fragment.  Will obtain CT renal stone study to evaluate for stone passage.  Continue n.p.o. status.     LOS: 2 days    [facility] 07/28/2022

## 2022-07-28 NOTE — Anesthesia Preprocedure Evaluation (Signed)
Anesthesia Evaluation  Patient identified by MRN, date of birth, ID band Patient awake    Reviewed: Allergy & Precautions, NPO status , Patient's Chart, lab work & pertinent test results  History of Anesthesia Complications Negative for: history of anesthetic complications  Airway Mallampati: II  TM Distance: >3 FB Neck ROM: full    Dental no notable dental hx.    Pulmonary neg pulmonary ROS   Pulmonary exam normal        Cardiovascular negative cardio ROS Normal cardiovascular exam     Neuro/Psych negative neurological ROS  negative psych ROS   GI/Hepatic negative GI ROS, Neg liver ROS,,,  Endo/Other  negative endocrine ROS    Renal/GU Renal disease     Musculoskeletal   Abdominal   Peds  Hematology negative hematology ROS (+)   Anesthesia Other Findings Past Medical History: No date: No known health problems  Past Surgical History: 10/2017: INTRAUTERINE DEVICE (IUD) INSERTION     Comment:  Kyleena No date: NO PAST SURGERIES  BMI    Body Mass Index: 33.92 kg/m      Reproductive/Obstetrics negative OB ROS                             Anesthesia Physical Anesthesia Plan  ASA: 2  Anesthesia Plan: General ETT   Post-op Pain Management: Toradol IV (intra-op)*, Ofirmev IV (intra-op)* and Dilaudid IV   Induction: Intravenous  PONV Risk Score and Plan: Ondansetron, Dexamethasone, Midazolam and Treatment may vary due to age or medical condition  Airway Management Planned: Oral ETT  Additional Equipment:   Intra-op Plan:   Post-operative Plan: Extubation in OR  Informed Consent: I have reviewed the patients History and Physical, chart, labs and discussed the procedure including the risks, benefits and alternatives for the proposed anesthesia with the patient or authorized representative who has indicated his/her understanding and acceptance.     Dental Advisory  Given  Plan Discussed with: Anesthesiologist, CRNA and Surgeon  Anesthesia Plan Comments: (Patient consented for risks of anesthesia including but not limited to:  - adverse reactions to medications - damage to eyes, teeth, lips or other oral mucosa - nerve damage due to positioning  - sore throat or hoarseness - Damage to heart, brain, nerves, lungs, other parts of body or loss of life  Patient voiced understanding.)       Anesthesia Quick Evaluation

## 2022-07-28 NOTE — Anesthesia Procedure Notes (Signed)
Procedure Name: LMA Insertion Date/Time: 07/28/2022 11:54 AM  Performed by: Rolla Plate, CRNAPre-anesthesia Checklist: Patient identified, Patient being monitored, Timeout performed, Emergency Drugs available and Suction available Patient Re-evaluated:Patient Re-evaluated prior to induction Oxygen Delivery Method: Circle system utilized Preoxygenation: Pre-oxygenation with 100% oxygen Induction Type: IV induction Ventilation: Mask ventilation without difficulty LMA: LMA inserted LMA Size: 4.0 Tube type: Oral Number of attempts: 1 Placement Confirmation: positive ETCO2 and breath sounds checked- equal and bilateral Tube secured with: Tape Dental Injury: Teeth and Oropharynx as per pre-operative assessment

## 2022-07-28 NOTE — Progress Notes (Signed)
[facility] Progress Note  Patient: Tonya May    ALP:[id number]  DOA: 07/26/2022     Date of Service: the patient was seen and examined on 07/28/2022  Chief Complaint  Patient presents with   Flank Pain   Brief hospital course: [name] is a 27 y.o. female with no other significant past medical history, who presents with left flank pain and dysuria.   Patient states that she has been having mild intermittent left flank pain for several months, which has been progressively worsening since last night.  The pain is constant, severe, sharp, radiating to the left abdomen area.  Associated with nausea and 1 episode of nonbilious nonbloody vomiting.  Patient does not have diarrhea.  She also has dysuria, burning on urination and increased urinary frequency.  No hematuria.  Denies chest pain, cough, shortness breath.  Patient has subjective and chills, but her temperature is 98.3 in ED.   Data reviewed independently and ED Course: pt was found to have WBC 21.3, positive urinalysis (hazy appearance, large amount of leukocyte, rare bacteria, WBC> 50), GFR> 60, negative pregnancy test.  Temperature 98.3, blood pressure 133/86, heart rate 110-120, RR 16.  Patient is admitted to telemetry bed as inpatient.  Dr. Glori Luis of urology is consulted.   CT of renal stone protocol: 1. Mild left hydronephrosis from a punctate distal ureteral calculus. 2. Hepatic steatosis.   Assessment and Plan: Sepsis due to acute pyelonephritis and left ureteral stone and hydronephrosis of left kidney: Patient meets criteria for sepsis with WBC 21.3, heart rate 110-120s.  Currently hemodynamically stable.  Consulted Dr. Glori Luis of urology. IVF: 3L of LR bolus in ED, followed by 125 cc/h of NS, decreased to NS 75 ml/hr Continue ceftriaxone 1 g IV Continue prn Zofran for nausea Continue prn morphine, Percocet, Tylenol Lactic acid 1.7, procalcitonin <0.1 Leukocytosis, WBC count 23k, continue to trend Blood culture  NGTD Urine culture 40 K MSSA, pansensitive 2/1 DC'd ceftriaxone and started Ancef IV, plan is to switch to oral antibiotics for 10 to 14 days as per urology 2/1 CT stone: The degree of hydronephrosis seen in the left kidney has not significantly changed from prior exam. The lack of IV contrast precludes assessment for a renal abscess or pyelonephritis. There is a persistent small 2 mm calcific density at the left UVJ. 2/1 procedure:Cystoscopy, left ureteroscopy, laser lithotripsy, left retrograde pyelogram with intraoperative interpretation, left ureteral stent placement  Intraoperative findings: Normal bladder, uncomplicated basket extraction of 2 mm left distal ureteral stone with stent placement   Body mass index is 33.92 kg/m.  Interventions:       Diet: Regular diet DVT Prophylaxis: SCD,   Advance goals of care discussion: Full code  Family Communication: family was notpresent at bedside, at the time of interview.  The pt provided permission to discuss medical plan with the family. Opportunity was given to ask question and all questions were answered satisfactorily.   Disposition:  Pt is from Home, admitted with left pyelonephritis, s/p left ureteral stent insertion done today by urology, plan is to discharge her tomorrow a.m. if remains stable.   Discharge to Home, most likely tomorrow a.m.    Subjective: No significant overnight events, patient denies any abdominal pain, no shortness of breath, no chest pain or palpitations. Patient was seen before the cystoscopy and stent insertion.  Patient wanted to stay after the procedure and discharge tomorrow a.m.   Physical Exam: General: NAD, lying comfortably Appear in no distress, affect appropriate  Eyes: PERRLA ENT: Oral Mucosa Clear, moist  Neck: no JVD,  Cardiovascular: S1 and S2 Present, no Murmur,  Respiratory: good respiratory effort, Bilateral Air entry equal and Decreased, no Crackles, no wheezes Abdomen: Bowel Sound  present, Soft, obese, mild left flank/CVA tenderness,  Skin: no rashes Extremities: no Pedal edema, no calf tenderness Neurologic: without any new focal findings Gait not checked due to patient safety concerns  Vitals:   07/28/22 1245 07/28/22 1255 07/28/22 1300 07/28/22 1320  BP: 110/61   110/70  Pulse: 96 96 96 92  Resp: (!) 22 18 17 16   Temp:  (!) 97.3 F (36.3 C)  98.2 F (36.8 C)  TempSrc:    Oral  SpO2: 99% 96% 95% 96%  Weight:      Height:        Intake/Output Summary (Last 24 hours) at 07/28/2022 1334 Last data filed at 07/28/2022 1150 Gross per 24 hour  Intake 1683.28 ml  Output --  Net 1683.28 ml   Filed Weights   07/26/22 0600 07/26/22 1721  Weight: 69.4 kg 89.6 kg    Data Reviewed: I have personally reviewed and interpreted daily labs, tele strips, imagings as discussed above. I reviewed all nursing notes, pharmacy notes, vitals, pertinent old records I have discussed plan of care as described above with RN and patient/family.  CBC: Recent Labs  Lab 07/26/22 0602 07/27/22 0546 07/28/22 0622  WBC 21.3* 23.7* 18.8*  NEUTROABS 15.8*  --   --   HGB 13.4 11.1* 10.3*  HCT 41.0 34.4* 31.3*  MCV 88.2 88.9 87.2  PLT 366 287 034   Basic Metabolic Panel: Recent Labs  Lab 07/26/22 0602 07/27/22 0546 07/28/22 0622  NA 134* 137 136  K 4.3 3.5 3.4*  CL 104 108 109  CO2 23 24 20*  GLUCOSE 119* 113* 106*  BUN 14 7 6   CREATININE 0.94 0.75 0.67  CALCIUM 9.0 8.1* 8.3*  MG  --   --  1.9  PHOS  --   --  2.2*    Studies: DG OR UROLOGY CYSTO IMAGE ([facility] ONLY)  Result Date: 07/28/2022 There is no interpretation for this exam.  This order is for images obtained during a surgical procedure.  Please See "Surgeries" Tab for more information regarding the procedure.   CT RENAL STONE STUDY  Result Date: 07/28/2022 CLINICAL DATA:  Flank pain EXAM: CT ABDOMEN AND PELVIS WITHOUT CONTRAST TECHNIQUE: Multidetector CT imaging of the abdomen and pelvis was performed  following the standard protocol without IV contrast. RADIATION DOSE REDUCTION: This exam was performed according to the departmental dose-optimization program which includes automated exposure control, adjustment of the mA and/or kV according to patient size and/or use of iterative reconstruction technique. COMPARISON:  CT Reanl Stone 07/26/22 FINDINGS: Limitations: Assessment of the abdominal and pelvic solid organs is limited in the absence of IV contrast. Lower chest: Bibasilar dependent atelectasis. Small left pleural effusion. Hepatobiliary: No focal liver lesions are visualized. Hepatic steatosis. Gallbladder is distended without evidence of cholelithiasis or other surrounding reactive changes to definitively suggest cholecystitis. Pancreas: No evidence of peripancreatic fat stranding to suggest pancreatitis. Spleen: Spleen is normal in size. Adrenals/Urinary Tract: Bilateral adrenal glands are normal in size. Bilateral kidneys are without evidence of nephrolithiasis. The degree of hydronephrosis previously seen in the left kidney has not significantly changed from prior exam. The urinary bladder is fluid-filled. The lack of IV contrast precludes assessment for a renal abscess or pyelonephritis. There is a persistent small 2 mm calcific  density at the left UVJ. Stomach/Bowel: No evidence of bowel obstruction. No focal wall thickening. The appendix is not well visualized. Vascular/Lymphatic: There are persistent prominent retroperitoneal lymph nodes, for example a para-aortic lymph node measuring up to 9 mm. Reproductive: Uterus and bilateral adnexa are unremarkable. Other: No abdominal wall hernia or abnormality. No abdominopelvic ascites. Musculoskeletal: No acute or significant osseous findings. IMPRESSION: 1. The degree of hydronephrosis seen in the left kidney has not significantly changed from prior exam. The lack of IV contrast precludes assessment for a renal abscess or pyelonephritis. There is a  persistent small 2 mm calcific density at the left UVJ. 2. Small left pleural effusion, new from prior exam. Electronically Signed   By: Marin Roberts M.D.   On: 07/28/2022 08:49    Scheduled Meds:  oxyCODONE       phosphorus  500 mg Oral QID   Continuous Infusions:  sodium chloride 75 mL/hr at 07/28/22 1138    ceFAZolin (ANCEF) IV     PRN Meds: acetaminophen, morphine injection, ondansetron (ZOFRAN) IV, mouth rinse, oxyCODONE, oxyCODONE-acetaminophen  Time spent: 35 minutes  Author: Val Riles. MD [facility] 07/28/2022 1:34 PM  To reach On-call, see care teams to locate the attending and reach out to them via [email or web address]. If 7PM-7AM, please contact night-coverage If you still have difficulty reaching the attending provider, please page the [facility] (Director on Call) for [facility] on amion for assistance.

## 2022-07-28 NOTE — Progress Notes (Signed)
Progress note update.  Today CT scan demonstrates that the left UVJ stone is still present.  I discussed the results with Tonya May and her mother.  We discussed the options of proceeding with ureteroscopy this afternoon or being discharged home with antibiotics, tamsulosin and pain medication with a follow-up on Tuesday.  They would both like to proceed with ureteroscopy today.  I explained how the procedure is performed and the risks involved.  I also discussed with them the symptoms of stent discomfort (dysuria, urgency, flank pain and hematuria) and advised them that we give medications to control the symptoms.  They both had their questions answered to their satisfaction and they are both in agreement with proceeding.  Please keep patient n.p.o.

## 2022-07-28 NOTE — Interval H&P Note (Signed)
UROLOGY H&P UPDATE  Agree with prior H&P dated 07/26/2022.  27 year old female with 1 to 2 mm left distal ureteral stone with mild hydronephrosis, low-grade fevers, equivocal UA, urine culture grew 40 K Staph aureus, has been on antibiotics for the last 48 hours.  We had previously offered ureteral stent placement and she opted for a trial of medical management and passage with her very small distal stone.  She is continued to have some intermittent left-sided groin and flank pain, and some persistent leukocytosis.  No fever over 101.  We reviewed options including continuing trial of medical expulsive therapy, ureteral stent placement, or attempted ureteroscopy and basket extraction with stent placement.  I think it is reasonable with 2 to 3 days of antibiotics and her small distal ureteral stone to try to remove the stone in 1 procedure instead of stenting with a delayed ureteroscopy.  Risks and benefits discussed extensively.  Cardiac: RRR Lungs: CTA bilaterally  Laterality: Left Procedure: Left ureteroscopy, laser lithotripsy/basket extraction, left ureteral stent placement  We specifically discussed the risks ureteroscopy including bleeding, infection/sepsis, stent related symptoms including flank pain/urgency/frequency/incontinence/dysuria, ureteral injury, inability to access stone, or need for staged or additional procedures.   Billey Co, MD 07/28/2022

## 2022-07-28 NOTE — Anesthesia Postprocedure Evaluation (Signed)
Anesthesia Post Note  Patient: Tonya May  Procedure(s) Performed: CYSTOSCOPY/URETEROSCOPY/HOLMIUM LASER/STENT PLACEMENT (Left) CYSTOSCOPY WITH RETROGRADE PYELOGRAM  Patient location during evaluation: PACU Anesthesia Type: General Level of consciousness: awake and alert Pain management: pain level controlled Vital Signs Assessment: post-procedure vital signs reviewed and stable Respiratory status: spontaneous breathing, nonlabored ventilation, respiratory function stable and patient connected to nasal cannula oxygen Cardiovascular status: blood pressure returned to baseline and stable Postop Assessment: no apparent nausea or vomiting Anesthetic complications: no   No notable events documented.   Last Vitals:  Vitals:   07/28/22 1245 07/28/22 1255  BP: 110/61   Pulse: 96 96  Resp: (!) 22 18  Temp:  (!) 36.3 C  SpO2: 99% 96%    Last Pain:  Vitals:   07/28/22 1255  TempSrc:   PainSc: 0-No pain                 Ilene Qua

## 2022-07-29 DIAGNOSIS — N12 Tubulo-interstitial nephritis, not specified as acute or chronic: Secondary | ICD-10-CM | POA: Diagnosis not present

## 2022-07-29 DIAGNOSIS — N10 Acute tubulo-interstitial nephritis: Secondary | ICD-10-CM | POA: Diagnosis not present

## 2022-07-29 DIAGNOSIS — N201 Calculus of ureter: Secondary | ICD-10-CM | POA: Diagnosis not present

## 2022-07-29 LAB — PHOSPHORUS: Phosphorus: 3.7 mg/dL (ref 2.5–4.6)

## 2022-07-29 LAB — CBC
HCT: 32.4 % — ABNORMAL LOW (ref 36.0–46.0)
Hemoglobin: 10.6 g/dL — ABNORMAL LOW (ref 12.0–15.0)
MCH: 28.6 pg (ref 26.0–34.0)
MCHC: 32.7 g/dL (ref 30.0–36.0)
MCV: 87.3 fL (ref 80.0–100.0)
Platelets: 347 10*3/uL (ref 150–400)
RBC: 3.71 MIL/uL — ABNORMAL LOW (ref 3.87–5.11)
RDW: 13.4 % (ref 11.5–15.5)
WBC: 13.1 10*3/uL — ABNORMAL HIGH (ref 4.0–10.5)
nRBC: 0 % (ref 0.0–0.2)

## 2022-07-29 LAB — MAGNESIUM: Magnesium: 2.1 mg/dL (ref 1.7–2.4)

## 2022-07-29 LAB — BASIC METABOLIC PANEL
Anion gap: 8 (ref 5–15)
BUN: 12 mg/dL (ref 6–20)
CO2: 21 mmol/L — ABNORMAL LOW (ref 22–32)
Calcium: 8.7 mg/dL — ABNORMAL LOW (ref 8.9–10.3)
Chloride: 108 mmol/L (ref 98–111)
Creatinine, Ser: 0.65 mg/dL (ref 0.44–1.00)
GFR, Estimated: >60 mL/min (ref >60)
Glucose, Bld: 137 mg/dL — ABNORMAL HIGH (ref 70–99)
Potassium: 3.9 mmol/L (ref 3.5–5.1)
Sodium: 137 mmol/L (ref 135–145)

## 2022-07-29 MED ORDER — ACETAMINOPHEN 325 MG PO TABS: 650.0000 mg | ORAL_TABLET | Freq: Four times a day (QID) | ORAL | Status: AC | PRN

## 2022-07-29 MED ORDER — OXYBUTYNIN CHLORIDE 5 MG PO TABS: 5.0000 mg | ORAL_TABLET | Freq: Three times a day (TID) | ORAL | Status: DC | PRN

## 2022-07-29 MED ORDER — TAMSULOSIN HCL 0.4 MG PO CAPS: 0.4000 mg | ORAL_CAPSULE | Freq: Every day | ORAL | Status: DC

## 2022-07-29 MED ORDER — OXYBUTYNIN CHLORIDE 5 MG PO TABS: 5.0000 mg | ORAL_TABLET | Freq: Three times a day (TID) | ORAL | 0 refills | Status: DC | PRN

## 2022-07-29 NOTE — Progress Notes (Signed)
Urology Consult Follow Up  Subjective: POD # 1 - Left URS/LL/left ureteral stent placement   She is feeling well this morning and she is up and engaged in her morning facial cleansing routine.  VSS afebrile   WBC count down to 13.1 from 18.8 yesterday.  Serum creatinine 0.65.  Urine culture is finalized with pansensitive Staph aureus.    Anti-infectives: Anti-infectives (From admission, onward)    Start     Dose/Rate Route Frequency Ordered Stop   07/28/22 1400  ceFAZolin (ANCEF) IVPB 1 g/50 mL premix        1 g 100 mL/hr over 30 Minutes Intravenous Every 8 hours 07/28/22 1214     07/28/22 1101  ciprofloxacin (CIPRO) 400 MG/200ML IVPB       Note to Pharmacy: Herby Abraham W: cabinet override      07/28/22 1101 07/28/22 1152   07/28/22 1045  ciprofloxacin (CIPRO) IVPB 400 mg        400 mg 200 mL/hr over 60 Minutes Intravenous  Once 07/28/22 0950 07/28/22 1220   07/27/22 0800  cefTRIAXone (ROCEPHIN) 1 g in sodium chloride 0.9 % 100 mL IVPB  Status:  Discontinued        1 g 200 mL/hr over 30 Minutes Intravenous Every 24 hours 07/26/22 1019 07/28/22 1214   07/26/22 0745  cefTRIAXone (ROCEPHIN) 1 g in sodium chloride 0.9 % 100 mL IVPB        1 g 200 mL/hr over 30 Minutes Intravenous  Once 07/26/22 0730 07/26/22 0830   07/26/22 0000  sulfamethoxazole-trimethoprim (BACTRIM DS) 800-160 MG tablet        1 tablet Oral 2 times daily 07/26/22 0809 08/05/22 2359       Current Facility-Administered Medications  Medication Dose Route Frequency Provider Last Rate Last Admin   acetaminophen (TYLENOL) tablet 650 mg  650 mg Oral Q6H PRN Ivor Costa, MD   650 mg at 07/28/22 0314   ceFAZolin (ANCEF) IVPB 1 g/50 mL premix  1 g Intravenous Q8H Val Riles, MD 100 mL/hr at 07/29/22 0500 1 g at 07/29/22 0500   morphine (PF) 2 MG/ML injection 2 mg  2 mg Intravenous Q4H PRN Ivor Costa, MD       ondansetron [facility]) injection 4 mg  4 mg Intravenous Q8H PRN Ivor Costa, MD   4 mg at 07/27/22 2039    Oral care mouth rinse  15 mL Mouth Rinse PRN Val Riles, MD       oxyCODONE-acetaminophen (PERCOCET/ROXICET) 5-325 MG per tablet 1 tablet  1 tablet Oral Q4H PRN Ivor Costa, MD   1 tablet at 07/29/22 0227     Objective: Vital signs in last 24 hours: Temp:  [96.9 F (36.1 C)-98.5 F (36.9 C)] 97.7 F (36.5 C) (02/02 0453) Pulse Rate:  [62-96] 62 (02/02 0453) Resp:  [14-22] 18 (02/02 0453) BP: (91-127)/(45-77) 112/70 (02/02 0453) SpO2:  [95 %-100 %] 97 % (02/02 0453)  Intake/Output from previous day: 02/01 0701 - 02/02 0700 In: 540 [P.O.:240; IV Piggyback:300] Out: -  Intake/Output this shift: No intake/output data recorded.   Physical Exam Constitutional:      General: She is not in acute distress.    Appearance: Normal appearance. She is obese. She is not ill-appearing, toxic-appearing or diaphoretic.  HENT:     Head: Normocephalic and atraumatic.     Nose: Nose normal.     Mouth/Throat:     Mouth: Mucous membranes are moist.     Pharynx: Oropharynx is clear.  Eyes:     Extraocular Movements: Extraocular movements intact.     Conjunctiva/sclera: Conjunctivae normal.     Pupils: Pupils are equal, round, and reactive to light.  Pulmonary:     Effort: Pulmonary effort is normal.  Abdominal:     General: Abdomen is flat.     Palpations: Abdomen is soft.  Musculoskeletal:        General: Normal range of motion.     Cervical back: Normal range of motion.  Skin:    General: Skin is warm and dry.  Neurological:     General: No focal deficit present.     Mental Status: She is alert and oriented to person, place, and time.  Psychiatric:        Mood and Affect: Mood normal.        Behavior: Behavior normal.        Thought Content: Thought content normal.        Judgment: Judgment normal.     Lab Results:  Recent Labs    07/28/22 0622 07/29/22 0411  WBC 18.8* 13.1*  HGB 10.3* 10.6*  HCT 31.3* 32.4*  PLT 296 347   BMET Recent Labs    07/28/22 0622  07/29/22 0411  NA 136 137  K 3.4* 3.9  CL 109 108  CO2 20* 21*  GLUCOSE 106* 137*  BUN 6 12  CREATININE 0.67 0.65  CALCIUM 8.3* 8.7*   PT/INR Recent Labs    07/26/22 1254  LABPROT 15.0  INR 1.2   ABG No results for input(s): "PHART", "HCO3" in the last 72 hours.  Invalid input(s): "PCO2", "PO2"  Studies/Results: DG OR UROLOGY CYSTO IMAGE ([place])  Result Date: 07/28/2022 There is no interpretation for this exam.  This order is for images obtained during a surgical procedure.  Please See "Surgeries" Tab for more information regarding the procedure.   CT RENAL STONE STUDY  Result Date: 07/28/2022 CLINICAL DATA:  Flank pain EXAM: CT ABDOMEN AND PELVIS WITHOUT CONTRAST TECHNIQUE: Multidetector CT imaging of the abdomen and pelvis was performed following the standard protocol without IV contrast. RADIATION DOSE REDUCTION: This exam was performed according to the departmental dose-optimization program which includes automated exposure control, adjustment of the mA and/or kV according to patient size and/or use of iterative reconstruction technique. COMPARISON:  CT Reanl Stone 07/26/22 FINDINGS: Limitations: Assessment of the abdominal and pelvic solid organs is limited in the absence of IV contrast. Lower chest: Bibasilar dependent atelectasis. Small left pleural effusion. Hepatobiliary: No focal liver lesions are visualized. Hepatic steatosis. Gallbladder is distended without evidence of cholelithiasis or other surrounding reactive changes to definitively suggest cholecystitis. Pancreas: No evidence of peripancreatic fat stranding to suggest pancreatitis. Spleen: Spleen is normal in size. Adrenals/Urinary Tract: Bilateral adrenal glands are normal in size. Bilateral kidneys are without evidence of nephrolithiasis. The degree of hydronephrosis previously seen in the left kidney has not significantly changed from prior exam. The urinary bladder is fluid-filled. The lack of IV contrast  precludes assessment for a renal abscess or pyelonephritis. There is a persistent small 2 mm calcific density at the left UVJ. Stomach/Bowel: No evidence of bowel obstruction. No focal wall thickening. The appendix is not well visualized. Vascular/Lymphatic: There are persistent prominent retroperitoneal lymph nodes, for example a para-aortic lymph node measuring up to 9 mm. Reproductive: Uterus and bilateral adnexa are unremarkable. Other: No abdominal wall hernia or abnormality. No abdominopelvic ascites. Musculoskeletal: No acute or significant osseous findings. IMPRESSION: 1. The degree of hydronephrosis seen  in the left kidney has not significantly changed from prior exam. The lack of IV contrast precludes assessment for a renal abscess or pyelonephritis. There is a persistent small 2 mm calcific density at the left UVJ. 2. Small left pleural effusion, new from prior exam. Electronically Signed   By: Marin Roberts M.D.   On: 07/28/2022 08:49     Assessment and Plan: 27 year old female who was admitted for pain control and IV antibiotics for pyelonephritis in the setting of a left distal punctate stone who underwent left ureteroscopy with laser lithotripsy with left ureteral stent placement yesterday with Dr. Diamantina Providence.  WBC count is trending down and serum creatinine has remained normal.  She is having minimal stent discomfort this morning.  Stable for discharge from urological standpoint.  Recommend 10 to 14 days of culture appropriate antibiotic.  Oxybutynin IR 5 mg 3 times daily as needed and tamsulosin 4 mg daily for stent discomfort.  We will contact her to coordinate an appointment for cystoscopy and stent removal.    LOS: 3 days    [facility] 07/29/2022

## 2022-07-29 NOTE — Discharge Summary (Signed)
[facility] Discharge Summary   Patient: Tonya May WYO:[id number]  PCP: Patient, No Pcp Per  Date of admission: 07/26/2022   Date of discharge:  07/29/2022     Discharge Diagnoses:  Principal Problem:   Acute pyelonephritis Active Problems:   Left ureteral stone   Sepsis ([place])   Hydronephrosis of left kidney   Admitted From: Home Disposition:  Home   Recommendations for Outpatient Follow-up:  Follow-up with PCP in 1 week, repeat CBC and BMP after 1 week Follow-up with urology after 1 week for left ureteral stent removal. Follow up LABS/TEST: CBC and BMP after 1 week   Follow-up Information     Billey Co, MD. Schedule an appointment as soon as possible for a visit in 1 week.   Specialty: Urology Contact information: [place] [phone]                Diet recommendation: Cardiac diet  Activity: The patient is advised to gradually reintroduce usual activities, as tolerated  Discharge Condition: stable  Code Status: Full code   History of present illness: As per the H and P dictated on admission Hospital Course:  Tonya May is a 27 y.o. female with no other significant past medical history, who presents with left flank pain and dysuria. Patient states that she has been having mild intermittent left flank pain for several months, which has been progressively worsening since last night.  The pain is constant, severe, sharp, radiating to the left abdomen area.  Associated with nausea and 1 episode of nonbilious nonbloody vomiting.  Patient does not have diarrhea.  She also has dysuria, burning on urination and increased urinary frequency.  No hematuria.  Denies chest pain, cough, shortness breath.  Patient has subjective and chills, but her temperature is 98.3 in ED. Data reviewed independently and ED Course: pt was found to have WBC 21.3, positive urinalysis (hazy appearance, large amount of leukocyte, rare bacteria,  WBC> 50), GFR> 60, negative pregnancy test.  Temperature 98.3, blood pressure 133/86, heart rate 110-120, RR 16.  Patient is admitted to telemetry bed as inpatient.  Dr. Glori Luis of urology is consulted. CT of renal stone protocol: 1. Mild left hydronephrosis from a punctate distal ureteral calculus. 2. Hepatic steatosis.   Assessment and Plan: Sepsis due to acute pyelonephritis and left ureteral stone and hydronephrosis of left kidney: Patient meets criteria for sepsis with WBC 21.3, heart rate 110-120s.  Currently hemodynamically stable.  Consulted Dr. Glori Luis of urology. S/p IVF: 3L of LR bolus in ED, followed by 125 cc/h of NS, decreased to NS 75 ml/hr. S/p ceftriaxone 1 g IV, which was switched to Ancef on 2/1 after urine culture reported MSSA pansensitive.  Patient was on as needed medication for pain control and nausea. Lactic acid 1.7, procalcitonin <0.1 Leukocytosis, WBC count 23k, ---13k trending down. Blood culture NGT. Urine culture 40 K MSSA, pansensitive. On 2/1 CT stone: The degree of hydronephrosis seen in the left kidney has not significantly changed from prior exam. The lack of IV contrast precludes assessment for a renal abscess or pyelonephritis. There is a persistent small 2 mm calcific density at the left UVJ. On 07/28/22 procedure:Cystoscopy, left ureteroscopy, laser lithotripsy, left retrograde pyelogram with intraoperative interpretation, left ureteral stent placement  Intraoperative findings: Normal bladder, uncomplicated basket extraction of 2 mm left distal ureteral stone with stent placement Patient was cleared by urology to discharge home on Bactrim for 10 days, Flomax daily and Ditropan  as needed for bladder spasms.  Percocet was also prescribed by urology.  Patient was recommended to follow after 1 week. # Obesity, body mass index is 33.92 kg/m.  Interventions: Calorie restricted diet and daily exercise advised to lose body weight.    Pain control, Percocet 5-325 mg  prescribed by urologist, so I am not sure whether urologist reviewed [place]  - Patient was instructed, not to drive, operate heavy machinery, perform activities at heights, swimming or participation in water activities or provide baby sitting services while on Pain, Sleep and Anxiety Medications; until her outpatient Physician has advised to do so again.  - Also recommended to not to take more than prescribed Pain, Sleep and Anxiety Medications.  Patient was ambulatory without any assistance. On the day of the discharge the patient's vitals were stable, and no other acute medical condition were reported by patient. the patient was felt safe to be discharge at Home.  Consultants: Urologist Procedures: Cystoscopy, left ureteroscopy, laser lithotripsy, left retrograde pyelogram with intraoperative interpretation, left ureteral stent placement   Discharge Exam: General: Appear in no distress, no Rash; Oral Mucosa Clear, moist. Cardiovascular: S1 and S2 Present, no Murmur, Respiratory: normal respiratory effort, Bilateral Air entry present and no Crackles, no wheezes Abdomen: Bowel Sound present, Soft, obese, mild left flank tenderness, no hernia Extremities: no Pedal edema, no calf tenderness Neurology: alert and oriented to time, place, and person affect appropriate.  Filed Weights   07/26/22 0600 07/26/22 1721  Weight: 69.4 kg 89.6 kg   Vitals:   07/29/22 0453 07/29/22 0750  BP: 112/70 115/67  Pulse: 62 62  Resp: 18 18  Temp: 97.7 F (36.5 C) 97.7 F (36.5 C)  SpO2: 97% 97%    DISCHARGE MEDICATION: Allergies as of 07/29/2022   No Known Allergies      Medication List     STOP taking these medications    ibuprofen 200 MG tablet Commonly known as: ADVIL   Kyleena 19.5 MG IUD Generic drug: levonorgestrel       TAKE these medications    acetaminophen 325 MG tablet Commonly known as: TYLENOL Take 2 tablets (650 mg total) by  mouth every 6 (six) hours as needed for mild pain or fever.   ondansetron 4 MG disintegrating tablet Commonly known as: ZOFRAN-ODT Take 1 tablet (4 mg total) by mouth every 8 (eight) hours as needed for nausea or vomiting.   oxybutynin 5 MG tablet Commonly known as: DITROPAN Take 1 tablet (5 mg total) by mouth every 8 (eight) hours as needed for bladder spasms.   oxyCODONE-acetaminophen 5-325 MG tablet Commonly known as: Percocet Take 1 tablet by mouth every 4 (four) hours as needed for up to 5 days for severe pain.   sulfamethoxazole-trimethoprim 800-160 MG tablet Commonly known as: BACTRIM DS Take 1 tablet by mouth 2 (two) times daily for 10 days.   tamsulosin 0.4 MG Caps capsule Commonly known as: FLOMAX Take 1 capsule (0.4 mg total) by mouth daily for 10 days.       No Known Allergies Discharge Instructions     Call MD for:  difficulty breathing, headache or visual disturbances   Complete by: As directed    Call MD for:  extreme fatigue   Complete by: As directed    Call MD for:  persistant dizziness or light-headedness   Complete by: As directed    Call MD for:  persistant nausea and vomiting   Complete by: As directed  Call MD for:  severe uncontrolled pain   Complete by: As directed    Call MD for:  temperature >100.4   Complete by: As directed    Diet - low sodium heart healthy   Complete by: As directed    Discharge instructions   Complete by: As directed    Follow-up with PCP in 1 week, repeat CBC and BMP after 1 week Follow-up with urology after 1 week for left ureteral stent removal.   Increase activity slowly   Complete by: As directed        The results of significant diagnostics from this hospitalization (including imaging, microbiology, ancillary and laboratory) are listed below for reference.    Significant Diagnostic Studies: DG OR UROLOGY CYSTO IMAGE ([facility] ONLY)  Result Date: 07/28/2022 There is no interpretation for this exam.  This order  is for images obtained during a surgical procedure.  Please See "Surgeries" Tab for more information regarding the procedure.   CT RENAL STONE STUDY  Result Date: 07/28/2022 CLINICAL DATA:  Flank pain EXAM: CT ABDOMEN AND PELVIS WITHOUT CONTRAST TECHNIQUE: Multidetector CT imaging of the abdomen and pelvis was performed following the standard protocol without IV contrast. RADIATION DOSE REDUCTION: This exam was performed according to the departmental dose-optimization program which includes automated exposure control, adjustment of the mA and/or kV according to patient size and/or use of iterative reconstruction technique. COMPARISON:  CT Reanl Stone 07/26/22 FINDINGS: Limitations: Assessment of the abdominal and pelvic solid organs is limited in the absence of IV contrast. Lower chest: Bibasilar dependent atelectasis. Small left pleural effusion. Hepatobiliary: No focal liver lesions are visualized. Hepatic steatosis. Gallbladder is distended without evidence of cholelithiasis or other surrounding reactive changes to definitively suggest cholecystitis. Pancreas: No evidence of peripancreatic fat stranding to suggest pancreatitis. Spleen: Spleen is normal in size. Adrenals/Urinary Tract: Bilateral adrenal glands are normal in size. Bilateral kidneys are without evidence of nephrolithiasis. The degree of hydronephrosis previously seen in the left kidney has not significantly changed from prior exam. The urinary bladder is fluid-filled. The lack of IV contrast precludes assessment for a renal abscess or pyelonephritis. There is a persistent small 2 mm calcific density at the left UVJ. Stomach/Bowel: No evidence of bowel obstruction. No focal wall thickening. The appendix is not well visualized. Vascular/Lymphatic: There are persistent prominent retroperitoneal lymph nodes, for example a para-aortic lymph node measuring up to 9 mm. Reproductive: Uterus and bilateral adnexa are unremarkable. Other: No abdominal wall  hernia or abnormality. No abdominopelvic ascites. Musculoskeletal: No acute or significant osseous findings. IMPRESSION: 1. The degree of hydronephrosis seen in the left kidney has not significantly changed from prior exam. The lack of IV contrast precludes assessment for a renal abscess or pyelonephritis. There is a persistent small 2 mm calcific density at the left UVJ. 2. Small left pleural effusion, new from prior exam. Electronically Signed   By: Marin Roberts M.D.   On: 07/28/2022 08:49   CT Renal Stone Study  Result Date: 07/26/2022 CLINICAL DATA:  Left flank pain since last night EXAM: CT ABDOMEN AND PELVIS WITHOUT CONTRAST TECHNIQUE: Multidetector CT imaging of the abdomen and pelvis was performed following the standard protocol without IV contrast. RADIATION DOSE REDUCTION: This exam was performed according to the departmental dose-optimization program which includes automated exposure control, adjustment of the mA and/or kV according to patient size and/or use of iterative reconstruction technique. COMPARISON:  01/02/2019 FINDINGS: Lower chest:  No contributory findings. Hepatobiliary: Marked hepatic steatosis.No evidence of biliary obstruction  or stone. Pancreas: Unremarkable. Spleen: Unremarkable. Adrenals/Urinary Tract: Negative adrenals. Left hydroureteronephrosis due to a punctate stone in the lower left ureter, just above the UVJ, marked on 2:89. No additional nephrolithiasis. Unremarkable bladder. Stomach/Bowel:  No obstruction. No visible bowel inflammation. Vascular/Lymphatic: No acute vascular abnormality. Trace atheromatous calcification at the right common iliac. No mass or adenopathy. Reproductive:No pathologic findings. Other: No ascites or pneumoperitoneum. Musculoskeletal: No acute abnormalities. IMPRESSION: 1. Mild left hydronephrosis from a punctate distal ureteral calculus. 2. Hepatic steatosis. Electronically Signed   By: Tiburcio Pea M.D.   On: 07/26/2022 07:21     Microbiology: Recent Results (from the past 240 hour(s))  Urine Culture (for pregnant, neutropenic or urologic patients or patients with an indwelling urinary catheter)     Status: Abnormal   Collection Time: 07/26/22  6:24 AM   Specimen: Urine, Clean Catch  Result Value Ref Range Status   Specimen Description   Final    URINE, CLEAN CATCH Performed at [facility], [address] [place], [place] [address]    Special Requests   Final    NONE Performed at [facility], [address] [place], [place] [address]    Culture 40,000 COLONIES/mL STAPHYLOCOCCUS AUREUS (A)  Final   Report Status 07/28/2022 FINAL  Final   Organism ID, Bacteria STAPHYLOCOCCUS AUREUS (A)  Final      Susceptibility   Staphylococcus aureus - MIC*    CIPROFLOXACIN <=0.5 SENSITIVE Sensitive     GENTAMICIN <=0.5 SENSITIVE Sensitive     NITROFURANTOIN <=16 SENSITIVE Sensitive     OXACILLIN 0.5 SENSITIVE Sensitive     TETRACYCLINE <=1 SENSITIVE Sensitive     VANCOMYCIN 1 SENSITIVE Sensitive     TRIMETH/SULFA <=10 SENSITIVE Sensitive     CLINDAMYCIN <=0.25 SENSITIVE Sensitive     RIFAMPIN <=0.5 SENSITIVE Sensitive     Inducible Clindamycin NEGATIVE Sensitive     * 40,000 COLONIES/mL STAPHYLOCOCCUS AUREUS  Culture, blood (x 2)     Status: None (Preliminary result)   Collection Time: 07/26/22 12:51 PM   Specimen: BLOOD  Result Value Ref Range Status   Specimen Description BLOOD BLOOD RIGHT ARM RAC  Final   Special Requests   Final    BOTTLES DRAWN AEROBIC AND ANAEROBIC Blood Culture adequate volume   Culture   Final    NO GROWTH 3 DAYS Performed at [facility], [address] [place], [place] [address]    Report Status PENDING  Incomplete  Culture, blood (x 2)     Status: None (Preliminary result)   Collection Time: 07/26/22 12:56 PM   Specimen: BLOOD  Result Value Ref Range Status   Specimen Description BLOOD BLOOD LEFT ARM LW  Final   Special Requests   Final     BOTTLES DRAWN AEROBIC AND ANAEROBIC Blood Culture adequate volume   Culture   Final    NO GROWTH 3 DAYS Performed at [facility], [address] [place], [place] [address]    Report Status PENDING  Incomplete     Labs: CBC: Recent Labs  Lab 07/26/22 0602 07/27/22 0546 07/28/22 0622 07/29/22 0411  WBC 21.3* 23.7* 18.8* 13.1*  NEUTROABS 15.8*  --   --   --   HGB 13.4 11.1* 10.3* 10.6*  HCT 41.0 34.4* 31.3* 32.4*  MCV 88.2 88.9 87.2 87.3  PLT 366 287 296 347   Basic Metabolic Panel: Recent Labs  Lab 07/26/22 0602 07/27/22 0546 07/28/22 0622 07/29/22 0411  NA 134* 137 136  137  K 4.3 3.5 3.4* 3.9  CL 104 108 109 108  CO2 23 24 20* 21*  GLUCOSE 119* 113* 106* 137*  BUN 14 7 6 12   CREATININE 0.94 0.75 0.67 0.65  CALCIUM 9.0 8.1* 8.3* 8.7*  MG  --   --  1.9 2.1  PHOS  --   --  2.2* 3.7   Liver Function Tests: Recent Labs  Lab 07/26/22 0602  AST 32  ALT 28  ALKPHOS 70  BILITOT 0.9  PROT 8.3*  ALBUMIN 4.4   No results for input(s): "LIPASE", "AMYLASE" in the last 168 hours. No results for input(s): "AMMONIA" in the last 168 hours. Cardiac Enzymes: No results for input(s): "CKTOTAL", "CKMB", "CKMBINDEX", "TROPONINI" in the last 168 hours. BNP (last 3 results) No results for input(s): "BNP" in the last 8760 hours. CBG: No results for input(s): "GLUCAP" in the last 168 hours.  Time spent: 35 minutes  Signed:  Val Riles  [facility] 07/29/2022 10:56 AM

## 2022-07-31 LAB — CULTURE, BLOOD (ROUTINE X 2)
Culture: NO GROWTH
Culture: NO GROWTH
Special Requests: ADEQUATE
Special Requests: ADEQUATE

## 2022-08-03 ENCOUNTER — Ambulatory Visit (INDEPENDENT_AMBULATORY_CARE_PROVIDER_SITE_OTHER): Payer: Medicaid Other | Admitting: Urology

## 2022-08-03 ENCOUNTER — Encounter: Payer: Self-pay | Admitting: Urology

## 2022-08-03 VITALS — BP 128/76 | HR 60 | Ht 64.0 in | Wt 195.0 lb

## 2022-08-03 DIAGNOSIS — Z466 Encounter for fitting and adjustment of urinary device: Secondary | ICD-10-CM | POA: Diagnosis not present

## 2022-08-03 MED ORDER — LIDOCAINE HCL URETHRAL/MUCOSAL 2 % EX GEL
1.0000 | Freq: Once | CUTANEOUS | Status: AC
  Administered 2022-08-03: 1 via URETHRAL

## 2022-08-03 NOTE — Progress Notes (Signed)
Cystoscopy Procedure Note:  Indication: Stent removal s/p 07/28/2022 left ureteroscopy, basket extraction of stone, stent placement  After informed consent and discussion of the procedure and its risks, Noel A Defino was positioned and prepped in the standard fashion. Cystoscopy was performed with a flexible cystoscope. The stent was grasped with flexible graspers and removed in its entirety. The patient tolerated the procedure well.  Findings: Uncomplicated stent removal  Assessment and Plan: We discussed general stone prevention strategies including adequate hydration with goal of producing 2.5 L of urine daily, increasing citric acid intake, increasing calcium intake during high oxalate meals, minimizing animal protein, and decreasing salt intake. Information about dietary recommendations given today.   Follow-up with urology as needed  Billey Co, MD 08/03/2022

## 2022-08-03 NOTE — Patient Instructions (Signed)

## 2022-08-17 ENCOUNTER — Ambulatory Visit: Payer: Self-pay | Admitting: Physician Assistant

## 2022-08-26 DIAGNOSIS — Z419 Encounter for procedure for purposes other than remedying health state, unspecified: Secondary | ICD-10-CM | POA: Diagnosis not present

## 2022-08-30 DIAGNOSIS — H5213 Myopia, bilateral: Secondary | ICD-10-CM | POA: Diagnosis not present

## 2022-09-26 DIAGNOSIS — Z419 Encounter for procedure for purposes other than remedying health state, unspecified: Secondary | ICD-10-CM | POA: Diagnosis not present

## 2022-10-03 DIAGNOSIS — H5213 Myopia, bilateral: Secondary | ICD-10-CM | POA: Diagnosis not present

## 2022-10-26 DIAGNOSIS — Z419 Encounter for procedure for purposes other than remedying health state, unspecified: Secondary | ICD-10-CM | POA: Diagnosis not present

## 2022-11-26 DIAGNOSIS — Z419 Encounter for procedure for purposes other than remedying health state, unspecified: Secondary | ICD-10-CM | POA: Diagnosis not present

## 2022-12-26 DIAGNOSIS — Z419 Encounter for procedure for purposes other than remedying health state, unspecified: Secondary | ICD-10-CM | POA: Diagnosis not present

## 2023-01-26 DIAGNOSIS — Z419 Encounter for procedure for purposes other than remedying health state, unspecified: Secondary | ICD-10-CM | POA: Diagnosis not present

## 2023-02-26 DIAGNOSIS — Z419 Encounter for procedure for purposes other than remedying health state, unspecified: Secondary | ICD-10-CM | POA: Diagnosis not present

## 2023-03-28 DIAGNOSIS — Z419 Encounter for procedure for purposes other than remedying health state, unspecified: Secondary | ICD-10-CM | POA: Diagnosis not present

## 2023-04-03 NOTE — Progress Notes (Unsigned)
New patient visit   Patient: Tonya May   DOB: 29-Nov-1995   27 y.o. Female  MRN: [id number] Visit Date: 04/04/2023  Today's healthcare provider: Ronnald Ramp, MD   No chief complaint on file.  Subjective    Tonya May is a 27 y.o. female who presents today as a new patient to establish care.      Discussed the use of AI scribe software for clinical note transcription with the patient, who gave verbal consent to proceed.  History of Present Illness              Past Medical History:  Diagnosis Date   Kidney stone    No known health problems     Outpatient Medications Prior to Visit  Medication Sig   acetaminophen (TYLENOL) 325 MG tablet Take 2 tablets (650 mg total) by mouth every 6 (six) hours as needed for mild pain or fever.   ondansetron (ZOFRAN-ODT) 4 MG disintegrating tablet Take 1 tablet (4 mg total) by mouth every 8 (eight) hours as needed for nausea or vomiting.   oxybutynin (DITROPAN) 5 MG tablet Take 1 tablet (5 mg total) by mouth every 8 (eight) hours as needed for bladder spasms.   No facility-administered medications prior to visit.    Past Surgical History:  Procedure Laterality Date   CYSTOSCOPY W/ RETROGRADES  07/28/2022   Procedure: CYSTOSCOPY WITH RETROGRADE PYELOGRAM;  Surgeon: Sondra Come, MD;  Location: [facility] ORS;  Service: Urology;;   CYSTOSCOPY/URETEROSCOPY/HOLMIUM LASER/STENT PLACEMENT Left 07/28/2022   Procedure: CYSTOSCOPY/URETEROSCOPY/HOLMIUM LASER/STENT PLACEMENT;  Surgeon: Sondra Come, MD;  Location: [facility] ORS;  Service: Urology;  Laterality: Left;   INTRAUTERINE DEVICE (IUD) INSERTION  10/2017   Kyleena   NO PAST SURGERIES     No family status information on file.   No family history on file. Social History   Socioeconomic History   Marital status: Single    Spouse name: Not on file   Number of children: Not on file   Years of education: Not on file   Highest education level: Not on file  Occupational  History   Not on file  Tobacco Use   Smoking status: Never   Smokeless tobacco: Never  Vaping Use   Vaping status: Never Used  Substance and Sexual Activity   Alcohol use: Yes    Comment: OCC   Drug use: Never   Sexual activity: Yes    Partners: Male    Birth control/protection: I.U.D.  Other Topics Concern   Not on file  Social History Narrative   Not on file   Social Determinants of Health   Financial Resource Strain: Not on file  Food Insecurity: No Food Insecurity (07/26/2022)   Hunger Vital Sign    Worried About Running Out of Food in the Last Year: Never true    Ran Out of Food in the Last Year: Never true  Transportation Needs: No Transportation Needs (07/26/2022)   PRAPARE - Administrator, Civil Service (Medical): No    Lack of Transportation (Non-Medical): No  Physical Activity: Not on file  Stress: Not on file  Social Connections: Not on file     No Known Allergies   There is no immunization history on file for this patient.  Health Maintenance  Topic Date Due   Hepatitis C Screening  Never done   DTaP/Tdap/Td (1 - Tdap) Never done   Cervical Cancer Screening (Pap smear)  09/02/2021   INFLUENZA VACCINE  Never done   COVID-19 Vaccine (1 - 2023-24 season) Never done   HIV Screening  Completed   HPV VACCINES  Aged Out    Patient Care Team: Patient, No Pcp Per as PCP - General (General Practice)  Review of Systems  {Insert previous labs (optional):23779} {See past labs  Heme  Chem  Endocrine  Serology  Results Review (optional):1}   Objective    There were no vitals taken for this visit. {Insert last BP/Wt (optional):23777}{See vitals history (optional):1}    Depression Screen     No data to display         No results found for any visits on 04/04/23.   Physical Exam ***    Assessment & Plan      Problem List Items Addressed This Visit   None   Assessment and Plan               No follow-ups on file.       Ronnald Ramp, MD  [facility] [phone] (phone) [phone] (fax)  [facility]

## 2023-04-04 ENCOUNTER — Ambulatory Visit: Payer: Medicaid Other | Admitting: Family Medicine

## 2023-04-04 ENCOUNTER — Encounter: Payer: Self-pay | Admitting: Family Medicine

## 2023-04-04 ENCOUNTER — Ambulatory Visit (INDEPENDENT_AMBULATORY_CARE_PROVIDER_SITE_OTHER): Payer: Medicaid Other | Admitting: Family Medicine

## 2023-04-04 ENCOUNTER — Telehealth: Payer: Self-pay | Admitting: Family Medicine

## 2023-04-04 VITALS — BP 117/82 | HR 74 | Temp 97.6°F | Resp 16 | Ht 64.0 in | Wt 196.9 lb

## 2023-04-04 DIAGNOSIS — Z7689 Persons encountering health services in other specified circumstances: Secondary | ICD-10-CM

## 2023-04-04 DIAGNOSIS — Z8669 Personal history of other diseases of the nervous system and sense organs: Secondary | ICD-10-CM | POA: Diagnosis not present

## 2023-04-04 DIAGNOSIS — L700 Acne vulgaris: Secondary | ICD-10-CM | POA: Diagnosis not present

## 2023-04-04 MED ORDER — CLINDAMYCIN PHOS-BENZOYL PEROX 1-5 % EX GEL: Freq: Two times a day (BID) | CUTANEOUS | 0 refills | Status: DC

## 2023-04-04 MED ORDER — CLINDAMYCIN PHOS-BENZOYL PEROX 1.2-3.75 % EX GEL: 1.0000 | Freq: Every evening | CUTANEOUS | 1 refills | Status: DC

## 2023-04-04 NOTE — Assessment & Plan Note (Signed)
Managed with over-the-counter Ibuprofen as needed. Chronic, stable no HA today  -Continue current management strategy.

## 2023-04-04 NOTE — Patient Instructions (Signed)
VISIT SUMMARY:  During your visit, we discussed your history of migraines, a past kidney stone incident, and your ongoing struggle with acne. We also touched on your general health and the need for a physical exam, which you haven't had in over a decade.  YOUR PLAN:  -ACNE: Your acne, which hasn't improved with previous treatments, may be related to stress and your menstrual cycle. I've prescribed a new topical treatment (Clindamycin/Benzoyl Peroxide) for you to use every night. Please also use sunscreen and avoid direct sun exposure as your skin may become more sensitive. We'll check on your progress in six weeks during your physical exam.  -MIGRAINES: You've been managing your migraines with over-the-counter ibuprofen as needed. We'll continue with this strategy for now.  -HISTORY OF KIDNEY STONE: You had a kidney stone earlier this year that required hospitalization. There's no current plan for this, but we'll monitor for any recurrence.  INSTRUCTIONS:  Please schedule a physical exam in six weeks. At that time, we'll also discuss the need for a Pap smear. If the new acne treatment doesn't work, I'll refer you to a dermatologist.

## 2023-04-04 NOTE — Telephone Encounter (Signed)
Patient has called to check on this, she states this medication is not at her pharmacy. Advised patient of information below that it needs a PA. Please follow back up with patient in regards to medication Clindamycin Phos-Benzoyl Perox 1-5% Gel and PA. Patients callback #[phone].

## 2023-04-04 NOTE — Telephone Encounter (Signed)
Covermymeds is requesting prior authorization Key: [id number] Name: Lalanne Clindamycin Phos-Benzoyl Perox 1-5% Gel

## 2023-04-04 NOTE — Assessment & Plan Note (Addendum)
Chronic issue with flare-ups, possibly related to stress and menstrual cycle. Previous treatments including oral antibiotics and topical creams have not been effective. -Prescribe Clindamycin/Benzoyl Peroxide topical treatment for nightly use. -Advise patient to use sunscreen and avoid direct sun exposure due to increased skin sensitivity. -Plan to reassess in six weeks during physical exam. -Referral to dermatology for acne, to be pursued if new treatment is not effective.

## 2023-04-04 NOTE — Assessment & Plan Note (Signed)
Welcomed patient to [facility]  Reviewed patient's medical history, medications, surgical and social history Discussed roles and expectations for primary care physician-patient relationship Recommended patient schedule annual preventative examinations   

## 2023-04-05 MED ORDER — CLINDAMYCIN PHOS-BENZOYL PEROX 1-5 % EX GEL: Freq: Two times a day (BID) | CUTANEOUS | 0 refills | Status: DC

## 2023-04-05 NOTE — Telephone Encounter (Signed)
PA initiated- to know the outcome takes 24-48 hours.

## 2023-04-05 NOTE — Telephone Encounter (Signed)
Will send in clindamycin-benzoyl peroxide gel  as listed on alternative agents.   Ronnald Ramp, MD  [facility]

## 2023-04-05 NOTE — Telephone Encounter (Signed)
Outcome Denied today by [facility] Medicaid 2017 Denied. Per the health plan's preferred drug list, at least 2 preferred drugs must be tried before requesting this drug or tell us why the member cannot try any preferred alternatives. Please send [place] supporting chart notes and lab results. Here is list of preferred alternatives: adapalene / benzoyl peroxide (generic for Epiduo Forte), adapalene / benzoyl peroxide (generic for Epiduo Gel), adapalene cream / gel (generic for Differin), azelaic acid gel (generic for Finacea), clindamycin phosphate pledgets / solution (generic for Cleocin-T), clindamycin-benzoyl peroxide gel (generic for Duac), erythromycin gel (generic for Emcin, Erycette, EryGel, et. al.), erythromycin solution (generic for [employer], EryDerm, EryMax, et. al.), erythromycin-benzoyl peroxide gel (generic for Benzamycin), Finacea gel, Retin-A cream/gel, Retin-A Micro gel. Per our records, the member has already tried None. Note: Some preferred drug(s) may have quantity limits. Refer to the health plan's preferred drug list for additional details. Drug Clindamycin Phos-Benzoyl Perox 1.2-3.

## 2023-04-11 NOTE — Telephone Encounter (Signed)
Outcome Denied today by [facility] Medicaid 2017 Denied. Per the health plan's preferred drug list, at least 2 preferred drugs must be tried before requesting this drug or tell us why the member cannot try any preferred alternatives. Please send [place] supporting chart notes and lab results. Here is list of preferred alternatives: adapalene / benzoyl peroxide (generic for Epiduo Forte), adapalene / benzoyl peroxide (generic for Epiduo Gel), adapalene cream / gel (generic for Differin), azelaic acid gel (generic for Finacea), clindamycin phosphate gel / lotion (generic for Cleocin-T, Clindagel), clindamycin phosphate pledgets / solution (generic for Cleocin-T), clindamycin-benzoyl peroxide gel 1.2-5% (generic for Duac), erythromycin gel (generic for Emcin, Erycette, EryGel), erythromycin solution (generic for Emcin, EryDerm, EryMax), erythromycin-benzoyl peroxide gel (generic for Benzamycin), Finacea Gel, Retin-A Cream / Gel, Retin-A Micro Gel. Note: Some preferred drug(s) may have quantity limits. Refer to the health plan's preferred drug list for additional details. Drug Clindamycin Phos-Benzoyl Perox 1-5% gel

## 2023-04-11 NOTE — Telephone Encounter (Signed)
Pt called in checking status of PA for med, clindamycin-benzoyl peroxide (BENZACLIN) gel . Please cb

## 2023-04-28 DIAGNOSIS — Z419 Encounter for procedure for purposes other than remedying health state, unspecified: Secondary | ICD-10-CM | POA: Diagnosis not present

## 2023-05-22 NOTE — Progress Notes (Deleted)
Complete physical exam   Patient: Tonya May   DOB: 1995/11/30   27 y.o. Female  MRN: [id number] Visit Date: 05/24/2023  Today's healthcare provider: Ronnald Ramp, MD   No chief complaint on file.  Subjective    Tonya May is a 27 y.o. female who presents today for a complete physical exam.   She reports consuming a {diet types:17450} diet.   {Exercise:19826}   She generally feels {well/fairly well/poorly:18703}.   She reports sleeping {well/fairly well/poorly:18703}.   Recommend Tetanus booster, Tdap   She {does/does not:200015} have additional problems to discuss today.   Discussed the use of AI scribe software for clinical note transcription with the patient, who gave verbal consent to proceed.  History of Present Illness             Past Medical History:  Diagnosis Date   Acute pyelonephritis 07/26/2022   Kidney stone    No known health problems    Past Surgical History:  Procedure Laterality Date   CYSTOSCOPY W/ RETROGRADES  07/28/2022   Procedure: CYSTOSCOPY WITH RETROGRADE PYELOGRAM;  Surgeon: Sondra Come, MD;  Location: [facility] ORS;  Service: Urology;;   CYSTOSCOPY/URETEROSCOPY/HOLMIUM LASER/STENT PLACEMENT Left 07/28/2022   Procedure: CYSTOSCOPY/URETEROSCOPY/HOLMIUM LASER/STENT PLACEMENT;  Surgeon: Sondra Come, MD;  Location: [facility];  Service: Urology;  Laterality: Left;   INTRAUTERINE DEVICE (IUD) INSERTION  10/2017   Tonya May   NO PAST SURGERIES     Social History   Socioeconomic History   Marital status: Single    Spouse name: Not on file   Number of children: Not on file   Years of education: Not on file   Highest education level: Not on file  Occupational History   Not on file  Tobacco Use   Smoking status: Never   Smokeless tobacco: Never  Vaping Use   Vaping status: Never Used  Substance and Sexual Activity   Alcohol use: Not Currently    Comment: OCC   Drug use: Never   Sexual activity: Yes    Partners:  Male    Birth control/protection: I.U.D.  Other Topics Concern   Not on file  Social History Narrative   Not on file   Social Determinants of Health   Financial Resource Strain: Not on file  Food Insecurity: No Food Insecurity (07/26/2022)   Hunger Vital Sign    Worried About Running Out of Food in the Last Year: Never true    Ran Out of Food in the Last Year: Never true  Transportation Needs: No Transportation Needs (07/26/2022)   PRAPARE - Administrator, Civil Service (Medical): No    Lack of Transportation (Non-Medical): No  Physical Activity: Not on file  Stress: Not on file  Social Connections: Not on file  Intimate Partner Violence: Not At Risk (07/26/2022)   Humiliation, Afraid, Rape, and Kick questionnaire    Fear of Current or Ex-Partner: No    Emotionally Abused: No    Physically Abused: No    Sexually Abused: No   No family status information on file.   No family history on file. No Known Allergies   Medications: Outpatient Medications Prior to Visit  Medication Sig   acetaminophen (TYLENOL) 325 MG tablet Take 2 tablets (650 mg total) by mouth every 6 (six) hours as needed for mild pain or fever.   clindamycin-benzoyl peroxide (BENZACLIN) gel Apply topically 2 (two) times daily.   No facility-administered medications prior to visit.  Review of Systems  {Insert previous labs (optional):23779} {See past labs  Heme  Chem  Endocrine  Serology  Results Review (optional):1}  Objective    There were no vitals taken for this visit. {Insert last BP/Wt (optional):23777}{See vitals history (optional):1}    Physical Exam  ***  Last depression screening scores    04/04/2023    9:40 AM  PHQ 2/9 Scores  PHQ - 2 Score 1  PHQ- 9 Score 5    Last fall risk screening    04/04/2023    9:41 AM  Fall Risk   Falls in the past year? 0  Number falls in past yr: 0  Injury with Fall? 0  Risk for fall due to : No Fall Risks    Last Audit-C  alcohol use screening     No data to display         A score of 3 or more in women, and 4 or more in men indicates increased risk for alcohol abuse, EXCEPT if all of the points are from question 1   No results found for any visits on 05/24/23.  Assessment & Plan    Routine Health Maintenance and Physical Exam   There is no immunization history on file for this patient.  Health Maintenance  Topic Date Due   Hepatitis C Screening  Never done   DTaP/Tdap/Td (1 - Tdap) Never done   Cervical Cancer Screening (Pap smear)  09/02/2021   COVID-19 Vaccine (1 - 2023-24 season) Never done   INFLUENZA VACCINE  09/25/2023 (Originally 01/26/2023)   HIV Screening  Completed   HPV VACCINES  Aged Out    Problem List Items Addressed This Visit   None   Assessment and Plan                No follow-ups on file.       Ronnald Ramp, MD  [facility] [phone] (phone) [phone] (fax)  [facility]

## 2023-05-24 ENCOUNTER — Encounter: Payer: Medicaid Other | Admitting: Family Medicine

## 2023-05-24 DIAGNOSIS — Z131 Encounter for screening for diabetes mellitus: Secondary | ICD-10-CM

## 2023-05-24 DIAGNOSIS — Z130 Encounter for screening for diseases of the blood and blood-forming organs and certain disorders involving the immune mechanism: Secondary | ICD-10-CM

## 2023-05-24 DIAGNOSIS — Z124 Encounter for screening for malignant neoplasm of cervix: Secondary | ICD-10-CM

## 2023-05-24 DIAGNOSIS — Z1159 Encounter for screening for other viral diseases: Secondary | ICD-10-CM

## 2023-05-24 DIAGNOSIS — Z13220 Encounter for screening for lipoid disorders: Secondary | ICD-10-CM

## 2023-05-24 DIAGNOSIS — L700 Acne vulgaris: Secondary | ICD-10-CM

## 2023-05-24 DIAGNOSIS — Z0000 Encounter for general adult medical examination without abnormal findings: Secondary | ICD-10-CM

## 2023-05-28 DIAGNOSIS — Z419 Encounter for procedure for purposes other than remedying health state, unspecified: Secondary | ICD-10-CM | POA: Diagnosis not present

## 2023-06-28 DIAGNOSIS — Z419 Encounter for procedure for purposes other than remedying health state, unspecified: Secondary | ICD-10-CM | POA: Diagnosis not present

## 2023-07-29 DIAGNOSIS — Z419 Encounter for procedure for purposes other than remedying health state, unspecified: Secondary | ICD-10-CM | POA: Diagnosis not present

## 2023-08-26 DIAGNOSIS — Z419 Encounter for procedure for purposes other than remedying health state, unspecified: Secondary | ICD-10-CM | POA: Diagnosis not present

## 2023-08-31 ENCOUNTER — Encounter: Payer: Self-pay | Admitting: Family Medicine

## 2023-08-31 ENCOUNTER — Ambulatory Visit
Admission: RE | Admit: 2023-08-31 | Discharge: 2023-08-31 | Disposition: A | Discharge status: Home / Self Care | Source: Ambulatory Visit | Attending: Family Medicine | Admitting: Family Medicine

## 2023-08-31 ENCOUNTER — Ambulatory Visit
Admission: RE | Admit: 2023-08-31 | Discharge: 2023-08-31 | Disposition: A | Discharge status: Home / Self Care | Attending: Family Medicine | Admitting: Family Medicine

## 2023-08-31 ENCOUNTER — Ambulatory Visit (INDEPENDENT_AMBULATORY_CARE_PROVIDER_SITE_OTHER): Payer: BLUE CROSS/BLUE SHIELD | Admitting: Family Medicine

## 2023-08-31 VITALS — BP 114/76 | HR 72 | Ht 64.0 in | Wt 197.0 lb

## 2023-08-31 DIAGNOSIS — Z7984 Long term (current) use of oral hypoglycemic drugs: Secondary | ICD-10-CM | POA: Diagnosis not present

## 2023-08-31 DIAGNOSIS — R300 Dysuria: Secondary | ICD-10-CM

## 2023-08-31 DIAGNOSIS — R3129 Other microscopic hematuria: Secondary | ICD-10-CM

## 2023-08-31 DIAGNOSIS — Z6833 Body mass index (BMI) 33.0-33.9, adult: Secondary | ICD-10-CM

## 2023-08-31 DIAGNOSIS — M25571 Pain in right ankle and joints of right foot: Secondary | ICD-10-CM | POA: Insufficient documentation

## 2023-08-31 DIAGNOSIS — R635 Abnormal weight gain: Secondary | ICD-10-CM

## 2023-08-31 DIAGNOSIS — L700 Acne vulgaris: Secondary | ICD-10-CM

## 2023-08-31 DIAGNOSIS — E66811 Obesity, class 1: Secondary | ICD-10-CM | POA: Diagnosis not present

## 2023-08-31 LAB — POCT URINALYSIS DIPSTICK
Bilirubin, UA: NEGATIVE
Glucose, UA: NEGATIVE
Ketones, UA: NEGATIVE
Leukocytes, UA: NEGATIVE
Nitrite, UA: NEGATIVE
Protein, UA: POSITIVE — AB
Spec Grav, UA: 1.025 (ref 1.010–1.025)
Urobilinogen, UA: 0.2 U/dL
pH, UA: 6 (ref 5.0–8.0)

## 2023-08-31 MED ORDER — METFORMIN HCL 500 MG PO TABS: 500.0000 mg | ORAL_TABLET | Freq: Two times a day (BID) | ORAL | 3 refills | Status: AC

## 2023-08-31 NOTE — Patient Instructions (Addendum)
 Please report to [facility] located at:  [address]  [place], [place] [address]  You do not need an appointment to have xrays completed.   Our office will follow up with  results once available.  VISIT SUMMARY:  Today, you were seen for acute back pain, right ankle pain, significant weight gain, and possible hormonal imbalance. We discussed your symptoms, potential causes, and planned several diagnostic tests and treatments to address your concerns.  YOUR PLAN:  -BACK PAIN: You have acute mid-back pain that radiates to the side, which may be related to kidney stones. We will conduct a urine test and blood tests to check for any underlying issues.  -RIGHT ANKLE PAIN: You are experiencing acute right ankle pain with a burning sensation, especially when pressure is applied. We will perform an x-ray to investigate further.  -WEIGHT GAIN: You have experienced significant weight gain over the past six years, which may be related to hormonal changes after your IUD removal. We will conduct several blood tests to check your metabolic and thyroid function. Additionally, we will start you on metformin to help with weight loss, and discuss other weight loss medications if needed.  -HORMONAL IMBALANCE: You may have a hormonal imbalance, suggested by your weight gain, acne, and changes in your menstrual cycle. We will evaluate you for polycystic ovary syndrome (PCOS) and recommend a follow-up with dermatology for your acne.  INSTRUCTIONS:  Please complete the lab work at the designated lab and schedule a follow-up appointment in one month.  For more information, you can read your full clinical note, available in your patient portal.

## 2023-08-31 NOTE — Assessment & Plan Note (Signed)
 Scheduled with derm  Previous topical was not covered by insurance

## 2023-08-31 NOTE — Addendum Note (Signed)
 Addended by: Bing Neighbors on: 08/31/2023 04:01 PM   Modules accepted: Orders

## 2023-08-31 NOTE — Addendum Note (Signed)
 Addended by: Bing Neighbors on: 08/31/2023 03:48 PM   Modules accepted: Orders

## 2023-08-31 NOTE — Progress Notes (Addendum)
 Established patient visit   Patient: Tonya May   DOB: [birth year]   28 y.o. Female  MRN: [id number] Visit Date: 08/31/2023  Today's healthcare provider: Rockie Agent, MD   Chief Complaint  Patient presents with   Back Pain    Middle back pain (possibly have something to do with her kidney stones)   Ankle Pain    R pain that hurt when she puts pressure on it    Weight Loss    Every since she had her IUD removed she can't lose weight    Subjective     HPI     Back Pain    Additional comments: Middle back pain (possibly have something to do with her kidney stones)        Ankle Pain    Additional comments: R pain that hurt when she puts pressure on it         Weight Loss    Additional comments: Every since she had her IUD removed she can't lose weight       Last edited by Thelbert Eulalio HERO, CMA on 08/31/2023  9:49 AM.       Discussed the use of AI scribe software for clinical note transcription with the patient, who gave verbal consent to proceed.  History of Present Illness   Tonya May is a 28 year old female who presents with acute back and right ankle pain.  She has been experiencing middle back pain for about a week and a half, which she associates with her history of kidney stones. The pain radiates to the side and is accompanied by dysuria, though there is no hematuria. No costovertebral angle tenderness is noted.  She reports right ankle pain that began about a week ago, described as a burning sensation, particularly when stepping incorrectly or applying pressure. She recalls a fall last year that resulted in a wrist injury and scrapes, but no significant ankle injury at that time.  She is concerned about her inability to lose weight since having her IUD removed about a year and a half ago. She notes a significant weight gain from 115-120 pounds to almost 200 pounds over the past six years. Despite dieting, exercising four times a week,  and cutting out sugar, she struggles to lose weight. She mentions hormonal imbalances and changes in her private areas, which she attributes to hormonal shifts. No family history of thyroid cancer is reported.  She experiences occasional constipation, sometimes not using the bathroom for a few days. No nipple discharge or snoring.         Past Medical History:  Diagnosis Date   Acute pyelonephritis 07/26/2022   Kidney stone    No known health problems     Medications: Outpatient Medications Prior to Visit  Medication Sig   acetaminophen  (TYLENOL ) 325 MG tablet Take 2 tablets (650 mg total) by mouth every 6 (six) hours as needed for mild pain or fever.   [DISCONTINUED] clindamycin -benzoyl peroxide (BENZACLIN) gel Apply topically 2 (two) times daily. (Patient not taking: Reported on 08/31/2023)   No facility-administered medications prior to visit.    Review of Systems  Last CBC Lab Results  Component Value Date   WBC 10.5 08/31/2023   HGB 13.4 08/31/2023   HCT 41.4 08/31/2023   MCV 90 08/31/2023   MCH 29.3 08/31/2023   RDW 12.6 08/31/2023   PLT 420 08/31/2023   Last metabolic panel Lab Results  Component Value Date  GLUCOSE 86 08/31/2023   NA 139 08/31/2023   K 5.0 08/31/2023   CL 103 08/31/2023   CO2 24 08/31/2023   BUN 7 08/31/2023   CREATININE 0.66 08/31/2023   GFRNONAA >60 07/29/2022   CALCIUM 9.8 08/31/2023   PHOS 3.7 07/29/2022   PROT 7.3 08/31/2023   ALBUMIN 4.6 08/31/2023   LABGLOB 2.7 08/31/2023   BILITOT 0.3 08/31/2023   ALKPHOS 90 08/31/2023   AST 29 08/31/2023   ALT 33 (H) 08/31/2023   ANIONGAP 8 07/29/2022   Last lipids Lab Results  Component Value Date   CHOL 180 08/31/2023   HDL 33 (L) 08/31/2023   LDLCALC 107 (H) 08/31/2023   TRIG 232 (H) 08/31/2023   CHOLHDL 5.5 (H) 08/31/2023   Last hemoglobin A1c Lab Results  Component Value Date   HGBA1C 5.6 08/31/2023   Last thyroid functions Lab Results  Component Value Date   TSH 3.700  08/31/2023        Objective    BP 114/76   Pulse 72   Ht 5' 4 (1.626 m)   Wt 197 lb (89.4 kg)   LMP 08/17/2023   SpO2 100%   BMI 33.81 kg/m  BP Readings from Last 3 Encounters:  08/31/23 114/76  04/04/23 117/82  08/03/22 128/76   Wt Readings from Last 3 Encounters:  08/31/23 197 lb (89.4 kg)  04/04/23 196 lb 14.4 oz (89.3 kg)  08/03/22 195 lb (88.5 kg)        Physical Exam  Physical Exam   NECK: No thyroid nodules palpated. ABDOMEN: Bowel sounds normal. No costovertebral angle tenderness.     General: Alert, no acute distress Cardio: Normal S1 and S2, RRR, no r/m/g Pulm: CTAB, normal work of breathing Skin: erythematous facial acne, no nodules, comedones  Results for orders placed or performed in visit on 08/31/23  Urine Culture   Specimen: Urine   Urine  Result Value Ref Range   Urine Culture, Routine Final report    Organism ID, Bacteria Comment   CBC  Result Value Ref Range   WBC 10.5 3.4 - 10.8 x10E3/uL   RBC 4.58 3.77 - 5.28 x10E6/uL   Hemoglobin 13.4 11.1 - 15.9 g/dL   Hematocrit 58.5 65.9 - 46.6 %   MCV 90 79 - 97 fL   MCH 29.3 26.6 - 33.0 pg   MCHC 32.4 31.5 - 35.7 g/dL   RDW 87.3 88.2 - 84.5 %   Platelets 420 150 - 450 x10E3/uL  Hemoglobin A1c  Result Value Ref Range   Hgb A1c MFr Bld 5.6 4.8 - 5.6 %   Est. average glucose Bld gHb Est-mCnc 114 mg/dL  RFE85+ZHQM  Result Value Ref Range   Glucose 86 70 - 99 mg/dL   BUN 7 6 - 20 mg/dL   Creatinine, Ser 9.33 0.57 - 1.00 mg/dL   eGFR 877 >40 fO/fpw/8.26   BUN/Creatinine Ratio 11 9 - 23   Sodium 139 134 - 144 mmol/L   Potassium 5.0 3.5 - 5.2 mmol/L   Chloride 103 96 - 106 mmol/L   CO2 24 20 - 29 mmol/L   Calcium 9.8 8.7 - 10.2 mg/dL   Total Protein 7.3 6.0 - 8.5 g/dL   Albumin 4.6 4.0 - 5.0 g/dL   Globulin, Total 2.7 1.5 - 4.5 g/dL   Bilirubin Total 0.3 0.0 - 1.2 mg/dL   Alkaline Phosphatase 90 44 - 121 IU/L   AST 29 0 - 40 IU/L   ALT 33 (H) 0 -  32 IU/L  TSH+T4F+T3Free  Result Value  Ref Range   TSH 3.700 0.450 - 4.500 uIU/mL   T3, Free 3.5 2.0 - 4.4 pg/mL   Free T4 1.19 0.82 - 1.77 ng/dL  Lipid Profile  Result Value Ref Range   Cholesterol, Total 180 100 - 199 mg/dL   Triglycerides 767 (H) 0 - 149 mg/dL   HDL 33 (L) >60 mg/dL   VLDL Cholesterol Cal 40 5 - 40 mg/dL   LDL Chol Calc (NIH) 892 (H) 0 - 99 mg/dL   Chol/HDL Ratio 5.5 (H) 0.0 - 4.4 ratio  Urinalysis, microscopic only  Result Value Ref Range   WBC, UA None seen 0 - 5 /hpf   RBC, Urine 0-2 0 - 2 /hpf   Epithelial Cells (non renal) 0-10 0 - 10 /hpf   Casts None seen None seen /lpf   Bacteria, UA Few None seen/Few  Specimen status report  Result Value Ref Range   specimen status report Comment   POCT Urinalysis Dipstick  Result Value Ref Range   Color, UA yellow    Clarity, UA clear    Glucose, UA Negative Negative   Bilirubin, UA neg    Ketones, UA neg    Spec Grav, UA 1.025 1.010 - 1.025   Blood, UA small    pH, UA 6.0 5.0 - 8.0   Protein, UA Positive (A) Negative   Urobilinogen, UA 0.2 0.2 or 1.0 E.U./dL   Nitrite, UA neg    Leukocytes, UA Negative Negative   Appearance     Odor      Assessment & Plan     Problem List Items Addressed This Visit       Musculoskeletal and Integument   Acne vulgaris   Scheduled with derm  Previous topical was not covered by insurance       Relevant Medications   metFORMIN  (GLUCOPHAGE ) 500 MG tablet     Other   Obesity (BMI 30.0-34.9) - Primary   Bmi 33.81 Significant weight gain from 115-120 lbs to nearly 200 lbs over six years, possibly related to hormonal changes post-IUD removal. Difficulty losing weight despite diet and exercise. Discussed obtaining complete metabolic panel, A1c, thyroid panel, and lipid panel. Potential use of metformin  500 mg once daily, increasing to twice daily if tolerated, with possible 3-5% body weight change. Advised to stop metformin  if experiencing vomiting or diarrhea. Discussed insurance coverage for weight loss  medications like Wegovy. - Order complete metabolic panel - Order A1c - Order thyroid panel - Order lipid panel - Prescribe metformin  500 mg once daily, increasing to twice daily if tolerated - Stop metformin  if experiencing vomiting or diarrhea - Discuss insurance coverage for weight loss medications like Wegovy      Relevant Medications   metFORMIN  (GLUCOPHAGE ) 500 MG tablet   Other Relevant Orders   Hemoglobin A1c (Completed)   CMP14+EGFR (Completed)   TSH+T4F+T3Free (Completed)   Lipid Profile (Completed)   Other Visit Diagnoses       Abnormal weight gain       Relevant Orders   Hemoglobin A1c (Completed)   CMP14+EGFR (Completed)   TSH+T4F+T3Free (Completed)   Lipid Profile (Completed)     Acute right ankle pain       Relevant Orders   DG Ankle Complete Right (Completed)     Dysuria       Relevant Orders   CBC (Completed)   Urine Culture (Completed)   POCT Urinalysis Dipstick (Completed)  Other microscopic hematuria       Relevant Orders   Urinalysis, microscopic only (Completed)          Back Pain Acute onset of mid-back pain radiating to the side, likely related to nephrolithiasis. No associated hematuria or dysuria. - Order urine sample, urine culture to evaluate for signs of hematuria/kidney stones  - Order basic metabolic panel - Order CBC  Right Ankle Pain Acute right ankle pain with burning sensation, exacerbated by pressure. No recent trauma, but fall last year. - Order right ankle x-ray    Hormonal Imbalance? Possible hormonal imbalance suggested by weight gain, acne, and changes in menstrual cycle. Periods are regular but short and heavy. Discussed evaluating for PCOS and dermatology follow-up for acne. - Evaluate for PCOS - Follow-up with dermatology for acne     Return in about 1 month (around 10/01/2023) for Weight MGMT.         Rockie Agent, MD  [facility] [phone]  (phone) [phone] (fax)  [facility]

## 2023-08-31 NOTE — Assessment & Plan Note (Signed)
 Bmi 33.81 Significant weight gain from 115-120 lbs to nearly 200 lbs over six years, possibly related to hormonal changes post-IUD removal. Difficulty losing weight despite diet and exercise. Discussed obtaining complete metabolic panel, A1c, thyroid panel, and lipid panel. Potential use of metformin 500 mg once daily, increasing to twice daily if tolerated, with possible 3-5% body weight change. Advised to stop metformin if experiencing vomiting or diarrhea. Discussed insurance coverage for weight loss medications like Wegovy. - Order complete metabolic panel - Order A1c - Order thyroid panel - Order lipid panel - Prescribe metformin 500 mg once daily, increasing to twice daily if tolerated - Stop metformin if experiencing vomiting or diarrhea - Discuss insurance coverage for weight loss medications like [facility]

## 2023-09-01 ENCOUNTER — Encounter: Payer: Self-pay | Admitting: Family Medicine

## 2023-09-01 DIAGNOSIS — R3129 Other microscopic hematuria: Secondary | ICD-10-CM | POA: Diagnosis not present

## 2023-09-01 LAB — CMP14+EGFR
ALT: 33 IU/L — ABNORMAL HIGH (ref 0–32)
AST: 29 IU/L (ref 0–40)
Albumin: 4.6 g/dL (ref 4.0–5.0)
Alkaline Phosphatase: 90 IU/L (ref 44–121)
BUN/Creatinine Ratio: 11 (ref 9–23)
BUN: 7 mg/dL (ref 6–20)
Bilirubin Total: 0.3 mg/dL (ref 0.0–1.2)
CO2: 24 mmol/L (ref 20–29)
Calcium: 9.8 mg/dL (ref 8.7–10.2)
Chloride: 103 mmol/L (ref 96–106)
Creatinine, Ser: 0.66 mg/dL (ref 0.57–1.00)
Globulin, Total: 2.7 g/dL (ref 1.5–4.5)
Glucose: 86 mg/dL (ref 70–99)
Potassium: 5 mmol/L (ref 3.5–5.2)
Sodium: 139 mmol/L (ref 134–144)
Total Protein: 7.3 g/dL (ref 6.0–8.5)
eGFR: 122 mL/min/{1.73_m2} (ref >59)

## 2023-09-01 LAB — CBC
Hematocrit: 41.4 % (ref 34.0–46.6)
Hemoglobin: 13.4 g/dL (ref 11.1–15.9)
MCH: 29.3 pg (ref 26.6–33.0)
MCHC: 32.4 g/dL (ref 31.5–35.7)
MCV: 90 fL (ref 79–97)
Platelets: 420 10*3/uL (ref 150–450)
RBC: 4.58 x10E6/uL (ref 3.77–5.28)
RDW: 12.6 % (ref 11.7–15.4)
WBC: 10.5 10*3/uL (ref 3.4–10.8)

## 2023-09-01 LAB — HEMOGLOBIN A1C
Est. average glucose Bld gHb Est-mCnc: 114 mg/dL
Hgb A1c MFr Bld: 5.6 % (ref 4.8–5.6)

## 2023-09-01 LAB — TSH+T4F+T3FREE
Free T4: 1.19 ng/dL (ref 0.82–1.77)
T3, Free: 3.5 pg/mL (ref 2.0–4.4)
TSH: 3.7 u[IU]/mL (ref 0.450–4.500)

## 2023-09-01 LAB — LIPID PANEL
Chol/HDL Ratio: 5.5 ratio — ABNORMAL HIGH (ref 0.0–4.4)
Cholesterol, Total: 180 mg/dL (ref 100–199)
HDL: 33 mg/dL — ABNORMAL LOW (ref >39)
LDL Chol Calc (NIH): 107 mg/dL — ABNORMAL HIGH (ref 0–99)
Triglycerides: 232 mg/dL — ABNORMAL HIGH (ref 0–149)
VLDL Cholesterol Cal: 40 mg/dL (ref 5–40)

## 2023-09-02 LAB — SPECIMEN STATUS REPORT

## 2023-09-02 LAB — URINALYSIS, MICROSCOPIC ONLY
Casts: NONE SEEN /LPF
WBC, UA: NONE SEEN /HPF (ref 0–5)

## 2023-09-02 LAB — URINE CULTURE

## 2023-09-11 DIAGNOSIS — S39012A Strain of muscle, fascia and tendon of lower back, initial encounter: Secondary | ICD-10-CM | POA: Diagnosis not present

## 2023-10-07 DIAGNOSIS — Z419 Encounter for procedure for purposes other than remedying health state, unspecified: Secondary | ICD-10-CM | POA: Diagnosis not present

## 2023-10-12 ENCOUNTER — Ambulatory Visit: Admitting: Family Medicine

## 2023-11-02 ENCOUNTER — Ambulatory Visit

## 2023-11-06 DIAGNOSIS — Z419 Encounter for procedure for purposes other than remedying health state, unspecified: Secondary | ICD-10-CM | POA: Diagnosis not present

## 2023-11-28 ENCOUNTER — Ambulatory Visit: Admitting: Family Medicine

## 2023-12-07 DIAGNOSIS — Z419 Encounter for procedure for purposes other than remedying health state, unspecified: Secondary | ICD-10-CM | POA: Diagnosis not present

## 2024-01-06 DIAGNOSIS — Z419 Encounter for procedure for purposes other than remedying health state, unspecified: Secondary | ICD-10-CM | POA: Diagnosis not present

## 2024-02-06 DIAGNOSIS — Z419 Encounter for procedure for purposes other than remedying health state, unspecified: Secondary | ICD-10-CM | POA: Diagnosis not present

## 2024-03-08 DIAGNOSIS — Z419 Encounter for procedure for purposes other than remedying health state, unspecified: Secondary | ICD-10-CM | POA: Diagnosis not present

## 2024-04-08 ENCOUNTER — Ambulatory Visit

## 2024-04-09 ENCOUNTER — Ambulatory Visit: Payer: BLUE CROSS/BLUE SHIELD | Admitting: Dermatology

## 2024-08-12 ENCOUNTER — Ambulatory Visit: Payer: Self-pay | Admitting: Physician Assistant
